# Patient Record
Sex: Male | Born: 1985 | Race: White | Hispanic: No | Marital: Single | Smoking: Never smoker
Health system: Southern US, Community
[De-identification: ages and names within clinical notes are randomized; demographics above are authoritative.]

## PROBLEM LIST (undated history)

## (undated) DIAGNOSIS — R51 Headache: Secondary | ICD-10-CM

## (undated) DIAGNOSIS — K219 Gastro-esophageal reflux disease without esophagitis: Secondary | ICD-10-CM

## (undated) DIAGNOSIS — R519 Headache, unspecified: Secondary | ICD-10-CM

## (undated) DIAGNOSIS — F329 Major depressive disorder, single episode, unspecified: Secondary | ICD-10-CM

## (undated) DIAGNOSIS — M779 Enthesopathy, unspecified: Secondary | ICD-10-CM

## (undated) DIAGNOSIS — F32A Depression, unspecified: Secondary | ICD-10-CM

## (undated) DIAGNOSIS — D649 Anemia, unspecified: Secondary | ICD-10-CM

## (undated) DIAGNOSIS — F419 Anxiety disorder, unspecified: Secondary | ICD-10-CM

## (undated) HISTORY — DX: Anemia, unspecified: D64.9

## (undated) HISTORY — DX: Enthesopathy, unspecified: M77.9

## (undated) HISTORY — DX: Gastro-esophageal reflux disease without esophagitis: K21.9

## (undated) HISTORY — DX: Depression, unspecified: F32.A

## (undated) HISTORY — DX: Major depressive disorder, single episode, unspecified: F32.9

## (undated) HISTORY — DX: Headache: R51

## (undated) HISTORY — DX: Headache, unspecified: R51.9

## (undated) HISTORY — DX: Anxiety disorder, unspecified: F41.9

---

## 2013-08-16 ENCOUNTER — Ambulatory Visit (INDEPENDENT_AMBULATORY_CARE_PROVIDER_SITE_OTHER): Payer: BC Managed Care – PPO | Admitting: Family Medicine

## 2013-08-16 ENCOUNTER — Encounter: Payer: Self-pay | Admitting: Family Medicine

## 2013-08-16 VITALS — BP 122/84 | HR 90 | Temp 100.0°F | Ht 73.0 in | Wt 199.0 lb

## 2013-08-16 DIAGNOSIS — J029 Acute pharyngitis, unspecified: Secondary | ICD-10-CM

## 2013-08-16 DIAGNOSIS — J020 Streptococcal pharyngitis: Secondary | ICD-10-CM

## 2013-08-16 MED ORDER — AMOXICILLIN 875 MG PO TABS: 875.0000 mg | ORAL_TABLET | Freq: Two times a day (BID) | ORAL | Status: DC

## 2013-08-16 NOTE — Progress Notes (Signed)
  Subjective:    Patient ID: Tyler Chase, male    DOB: [birth year], 27 y.o.   MRN: [id number]  HPI New to establish.  No consistent PCP recently.  Neuro- Dr Tyler Chase in [place] (migraines)  Sore throat- started w/ feeling run down on Friday, by Saturday had fever.  + sore throat.  + nasal congestion- mild.  Intermittent ear pain.  Rare dry cough.  No known sick contacts.  No nausea/vomiting/diarrhea.   Review of Systems For ROS see HPI     Objective:   Physical Exam  Vitals reviewed. Constitutional: He appears well-developed and well-nourished. No distress.  HENT:  Head: Normocephalic and atraumatic.  No TTP over sinuses TMs WNL bilaterally Bilateral tonsillar enlargement, erythema w/ exudate present  Cardiovascular: Normal rate, regular rhythm and normal heart sounds.   Pulmonary/Chest: Effort normal and breath sounds normal. No respiratory distress. He has no wheezes. He has no rales.  Lymphadenopathy:    He has cervical adenopathy (TTP).  Skin: Skin is warm.          Assessment & Plan:

## 2013-08-16 NOTE — Patient Instructions (Addendum)
This is strep Start the Amoxicillin twice daily- take w/ food Drink plenty of fluids REST! Start ibuprofen for pain and fever Hang in there!!

## 2013-08-16 NOTE — Assessment & Plan Note (Signed)
New.  Despite negative rapid test, his clinical sxs and PE consistent w/ strep (fever, tonsillar enlargement w/ erythema and exudate, tender LAD).  Start amox.  Reviewed supportive care and red flags that should prompt return.  Pt expressed understanding and is in agreement w/ plan.

## 2013-10-13 ENCOUNTER — Telehealth: Payer: Self-pay

## 2013-10-13 NOTE — Telephone Encounter (Addendum)
Medication List and allergies: reviewed and updated  90 day supply/mail order: na Local prescriptions: [facility] [address]  Immunizations due: offered Tdap  A/P:   Flu vacccine UTD 09/15/2013 No change in FH or PSH  To Discuss with Provider: Will discuss questions with provider

## 2013-10-13 NOTE — Telephone Encounter (Signed)
Left message for call back Non identifiable  

## 2013-10-14 ENCOUNTER — Encounter: Payer: Self-pay | Admitting: Family Medicine

## 2013-10-14 ENCOUNTER — Ambulatory Visit (INDEPENDENT_AMBULATORY_CARE_PROVIDER_SITE_OTHER): Payer: BC Managed Care – PPO | Admitting: Family Medicine

## 2013-10-14 VITALS — BP 130/88 | HR 88 | Temp 98.3°F | Resp 16 | Ht 72.5 in | Wt 193.5 lb

## 2013-10-14 DIAGNOSIS — Z20828 Contact with and (suspected) exposure to other viral communicable diseases: Secondary | ICD-10-CM

## 2013-10-14 DIAGNOSIS — Z0000 Encounter for general adult medical examination without abnormal findings: Secondary | ICD-10-CM

## 2013-10-14 DIAGNOSIS — K219 Gastro-esophageal reflux disease without esophagitis: Secondary | ICD-10-CM

## 2013-10-14 DIAGNOSIS — G4700 Insomnia, unspecified: Secondary | ICD-10-CM

## 2013-10-14 DIAGNOSIS — R159 Full incontinence of feces: Secondary | ICD-10-CM

## 2013-10-14 DIAGNOSIS — F411 Generalized anxiety disorder: Secondary | ICD-10-CM

## 2013-10-14 DIAGNOSIS — K625 Hemorrhage of anus and rectum: Secondary | ICD-10-CM | POA: Insufficient documentation

## 2013-10-14 LAB — CBC WITH DIFFERENTIAL/PLATELET
Basophils Relative: 0.4 % (ref 0.0–3.0)
Eosinophils Absolute: 0.1 10*3/uL (ref 0.0–0.7)
HCT: 45.2 % (ref 39.0–52.0)
Hemoglobin: 14.5 g/dL (ref 13.0–17.0)
Lymphocytes Relative: 26.7 % (ref 12.0–46.0)
Lymphs Abs: 1.6 10*3/uL (ref 0.7–4.0)
MCHC: 32.1 g/dL (ref 30.0–36.0)
MCV: 69.3 fl — ABNORMAL LOW (ref 78.0–100.0)
Neutro Abs: 4 10*3/uL (ref 1.4–7.7)
RBC: 6.53 Mil/uL — ABNORMAL HIGH (ref 4.22–5.81)

## 2013-10-14 LAB — HEPATIC FUNCTION PANEL
ALT: 20 U/L (ref 0–53)
Total Bilirubin: 1.1 mg/dL (ref 0.3–1.2)
Total Protein: 7.5 g/dL (ref 6.0–8.3)

## 2013-10-14 LAB — BASIC METABOLIC PANEL
BUN: 15 mg/dL (ref 6–23)
Chloride: 103 mEq/L (ref 96–112)
Creatinine, Ser: 1.3 mg/dL (ref 0.4–1.5)

## 2013-10-14 LAB — TSH: TSH: 0.69 u[IU]/mL (ref 0.35–5.50)

## 2013-10-14 LAB — LIPID PANEL
Cholesterol: 157 mg/dL (ref 0–200)
VLDL: 16.4 mg/dL (ref 0.0–40.0)

## 2013-10-14 MED ORDER — FLUOXETINE HCL 20 MG PO TABS: 20.0000 mg | ORAL_TABLET | Freq: Every day | ORAL | Status: DC

## 2013-10-14 MED ORDER — TRAZODONE HCL 100 MG PO TABS: 100.0000 mg | ORAL_TABLET | Freq: Every day | ORAL | Status: DC

## 2013-10-14 MED ORDER — PANTOPRAZOLE SODIUM 40 MG PO TBEC: 40.0000 mg | DELAYED_RELEASE_TABLET | Freq: Every day | ORAL | Status: DC

## 2013-10-14 NOTE — Progress Notes (Signed)
  Subjective:    Patient ID: Tyler Chase, male    DOB: 01/28/1986, 27 y.o.   MRN: [id number]  HPI CPE-   Insomnia- pt was given Trazodone by Neuro (Dr Carlton Adam) 1 month ago.  Feels that the current dose (50mg ?) is not very helpful in keeping him asleep which is the problem.  Anxiety- chronic problem, pt doesn't feel sxs are well controlled.  Currently taking klonopin as needed (came from previous doctor).  Has previously been on Wellbutrin and Citalopram w/out relief.  Fecal leakage- sxs started 1-2 yrs ago.  Will note itching 1-2 hrs after BM and return to bathroom, needing to re-wipe.  Has fecal matter on tissue but not in underwear.  Will have BRBPR ~1-2x/month.  GERD- pt reports frequent and severe heartburn.  Will take tums as needed.  Has not tried OTC acid suppression.   Review of Systems Patient reports no vision/hearing changes, anorexia, fever ,adenopathy, persistant/recurrent hoarseness, swallowing issues, chest pain, palpitations, edema, persistant/recurrent cough, hemoptysis, dyspnea (rest,exertional, paroxysmal nocturnal), abdominal pain, GU symptoms (dysuria, hematuria, voiding/incontinence issues) syncope, focal weakness, memory loss, numbness & tingling, skin/hair/nail changes, depression, abnormal bruising/bleeding, musculoskeletal symptoms/signs.     Objective:   Physical Exam BP 130/88  Pulse 88  Temp(Src) 98.3 F (36.8 C) (Oral)  Resp 16  Ht 6' 0.5" (1.842 m)  Wt 193 lb 8 oz (87.771 kg)  BMI 25.87 kg/m2  SpO2 95%  General Appearance:    Alert, cooperative, no distress, appears stated age  Head:    Normocephalic, without obvious abnormality, atraumatic  Eyes:    PERRL, conjunctiva/corneas clear, EOM's intact, fundi    benign, both eyes       Ears:    Normal TM's and external ear canals, both ears  Nose:   Nares normal, septum midline, mucosa normal, no drainage   or sinus tenderness  Throat:   Lips, mucosa, and tongue normal; teeth and gums normal   Neck:   Supple, symmetrical, trachea midline, no adenopathy;       thyroid:  No enlargement/tenderness/nodules  Back:     Symmetric, no curvature, ROM normal, no CVA tenderness  Lungs:     Clear to auscultation bilaterally, respirations unlabored  Chest wall:    No tenderness or deformity  Heart:    Regular rate and rhythm, S1 and S2 normal, no murmur, rub   or gallop  Abdomen:     Soft, non-tender, bowel sounds active all four quadrants,    no masses, no organomegaly  Genitalia:    Deferred at pt's request  Rectal:    Deferred due to young age  Extremities:   Extremities normal, atraumatic, no cyanosis or edema  Pulses:   2+ and symmetric all extremities  Skin:   Skin color, texture, turgor normal, no rashes or lesions  Lymph nodes:   Cervical, supraclavicular, and axillary nodes normal  Neurologic:   CNII-XII intact. Normal strength, sensation and reflexes      throughout          Assessment & Plan:

## 2013-10-14 NOTE — Patient Instructions (Signed)
Follow up in 4-6 weeks to recheck mood Start the Prozac daily Increase the trazodone to 100mg  nightly (1 tab of new script) We'll call you with your GI appt We'll notify you of your lab results and make any changes if needed Start the Protonix daily for reflux Call with any questions or concerns Happy Halloween!

## 2013-10-15 NOTE — Assessment & Plan Note (Signed)
New to provider.  On Trazodone per neuro.  Not effective.  Increase dose.

## 2013-10-15 NOTE — Assessment & Plan Note (Signed)
New.  Pt reports fecal leakage after BMs.  Not soiling underwear but requiring him to return to the restroom to wipe.  Refer to GI for complete evaluation and tx

## 2013-10-15 NOTE — Assessment & Plan Note (Signed)
New.  Suspect hemorrhoids but given associated fecal leakage will refer to GI- especially since pt declined rectal exam.

## 2013-10-15 NOTE — Assessment & Plan Note (Addendum)
New to provider.  Not well controlled.  Start daily controller medication.  Will monitor for improvement.

## 2013-10-15 NOTE — Assessment & Plan Note (Signed)
Pt's PE WNL despite numerous complaints.  Check labs.  Anticipatory guidance provided.

## 2013-10-15 NOTE — Assessment & Plan Note (Signed)
New.  Pt reports sxs are severe and will have nocturnal regurgitation.  Start PPI.  Will monitor for improvement.

## 2013-10-17 ENCOUNTER — Encounter: Payer: Self-pay | Admitting: General Practice

## 2013-10-28 ENCOUNTER — Ambulatory Visit (INDEPENDENT_AMBULATORY_CARE_PROVIDER_SITE_OTHER): Payer: BC Managed Care – PPO | Admitting: Gastroenterology

## 2013-10-28 ENCOUNTER — Other Ambulatory Visit (INDEPENDENT_AMBULATORY_CARE_PROVIDER_SITE_OTHER): Payer: BC Managed Care – PPO

## 2013-10-28 ENCOUNTER — Encounter: Payer: Self-pay | Admitting: Gastroenterology

## 2013-10-28 VITALS — BP 96/70 | HR 72 | Ht 71.5 in | Wt 188.4 lb

## 2013-10-28 DIAGNOSIS — K3189 Other diseases of stomach and duodenum: Secondary | ICD-10-CM

## 2013-10-28 DIAGNOSIS — R634 Abnormal weight loss: Secondary | ICD-10-CM

## 2013-10-28 DIAGNOSIS — K625 Hemorrhage of anus and rectum: Secondary | ICD-10-CM

## 2013-10-28 DIAGNOSIS — R197 Diarrhea, unspecified: Secondary | ICD-10-CM

## 2013-10-28 LAB — FOLATE: Folate: >24.8 ng/mL (ref >5.9)

## 2013-10-28 LAB — FERRITIN: Ferritin: 53.6 ng/mL (ref 22.0–322.0)

## 2013-10-28 LAB — VITAMIN B12: Vitamin B-12: 597 pg/mL (ref 211–911)

## 2013-10-28 LAB — IBC PANEL
Iron: 181 ug/dL — ABNORMAL HIGH (ref 42–165)
Transferrin: 253.1 mg/dL (ref 212.0–360.0)

## 2013-10-28 MED ORDER — HYDROCORTISONE ACETATE 25 MG RE SUPP: 25.0000 mg | Freq: Two times a day (BID) | RECTAL | Status: DC

## 2013-10-28 NOTE — Assessment & Plan Note (Signed)
Limited rectal bleeding and soreness are 2 to hemorrhoidal disease  Recommendations #1 Anusol a.c. Suppositories #2 band ligation of hemorrhoids

## 2013-10-28 NOTE — Assessment & Plan Note (Signed)
While the patient complains of nonspecific abdominal bloating, lab work was pertinent for microcytosis.  With history of anemia and family history of anemia the question of celiac disease is raised.  Recommendations #1 check serologies for celiac disease, iron, TIBC, ferritin folate and B12 levels

## 2013-10-28 NOTE — Patient Instructions (Signed)
Go to the basement for labs today Your Hemorrhoidal banding is scheduled on 11/02/2013 at 2pm If you need to cancel this appointment call [phone] to cancel

## 2013-10-28 NOTE — Progress Notes (Signed)
History of Present Illness: 27 year old white male referred for evaluation of rectal bleeding and soreness.  Soreness is been a problem for years.  He has pain when he moves his bowels.  He's complaining of rectal itching.  Most recently he's been seen small amounts of blood on the toilet tissue with every bowel movement.  He moves his bowels regularly.  Patient complains of abdominal bloating.  He was told to be anemic when he was a teenager.  Recent lab work was pertinent for hemoglobin 14.5 and MCV 69.3.  He related that both his sister and mother were told to be anemic.    Past Medical History  Diagnosis Date  . Depression   . Generalized headaches   . GERD (gastroesophageal reflux disease)   . Anemia   . Anxiety    History reviewed. No pertinent past surgical history. family history includes Arthritis in his mother; Heart disease in his maternal grandfather; Hypertension in his maternal grandfather; Lymphoma in his father; Mental illness in his maternal uncle; Migraines in his mother. Current Outpatient Prescriptions  Medication Sig Dispense Refill  . FLUoxetine (PROZAC) 20 MG tablet Take 1 tablet (20 mg total) by mouth daily.  30 tablet  3  . pantoprazole (PROTONIX) 40 MG tablet Take 40 mg by mouth daily as needed.      . topiramate (TOPAMAX) 25 MG capsule Take 25 mg by mouth 3 (three) times daily.      . traZODone (DESYREL) 100 MG tablet Take 100 mg by mouth at bedtime as needed.       No current facility-administered medications for this visit.   Allergies as of 10/28/2013  . (No Known Allergies)    reports that he has never smoked. He has never used smokeless tobacco. He reports that he drinks alcohol. He reports that he does not use illicit drugs.     Review of Systems: Pertinent positive and negative review of systems were noted in the above HPI section. All other review of systems were otherwise negative.  Vital signs were reviewed in today's medical record Physical  Exam: General: Well developed , well nourished, no acute distress Skin: anicteric Head: Normocephalic and atraumatic Eyes:  sclerae anicteric, EOMI Ears: Normal auditory acuity Mouth: No deformity or lesions Neck: Supple, no masses or thyromegaly Lungs: Clear throughout to auscultation Heart: Regular rate and rhythm; no murmurs, rubs or bruits Abdomen: Soft, non tender and non distended. No masses, hepatosplenomegaly or hernias noted. Normal Bowel sounds Rectal: See anoscopic exam report Musculoskeletal: Symmetrical with no gross deformities  Skin: No lesions on visible extremities Pulses:  Normal pulses noted Extremities: No clubbing, cyanosis, edema or deformities noted Neurological: Alert oriented x 4, grossly nonfocal Cervical Nodes:  No significant cervical adenopathy Inguinal Nodes: No significant inguinal adenopathy Psychological:  Alert and cooperative. Normal mood and affect  Anoscopy was done.  Internal hemorrhoids were seen.  There were no obvious fissures or masses.

## 2013-11-01 ENCOUNTER — Telehealth: Payer: Self-pay

## 2013-11-01 LAB — RETICULIN ANTIBODIES, IGA W TITER

## 2013-11-01 LAB — GLIADIN ANTIBODIES, SERUM: Gliadin IgA: 4.2 U/mL (ref <20)

## 2013-11-02 ENCOUNTER — Encounter: Payer: Self-pay | Admitting: Gastroenterology

## 2013-11-02 ENCOUNTER — Ambulatory Visit (INDEPENDENT_AMBULATORY_CARE_PROVIDER_SITE_OTHER): Payer: BC Managed Care – PPO | Admitting: Gastroenterology

## 2013-11-02 VITALS — BP 100/60 | HR 60 | Ht 71.5 in | Wt 188.0 lb

## 2013-11-02 DIAGNOSIS — K648 Other hemorrhoids: Secondary | ICD-10-CM

## 2013-11-02 DIAGNOSIS — K602 Anal fissure, unspecified: Secondary | ICD-10-CM

## 2013-11-02 NOTE — Progress Notes (Signed)
PROCEDURE NOTE: The patient presents with symptomatic grade *2**  hemorrhoids, requesting rubber band ligation of his/her hemorrhoidal disease.  All risks, benefits and alternative forms of therapy were described and informed consent was obtained.   Endoscopy was performed.  The 3 hemorrhoidal bundles were identified.  There were no fissures.  The anorectum was pre-medicated with lubricant and nitroglycerine ointment The decision was made to band the *left lateral** internal hemorrhoid, and the [facility] O'Regan System was used to perform band ligation without complication.  Digital anorectal examination was then performed to assure proper positioning of the band, and to adjust the banded tissue as required.  The patient was discharged home without pain or other issues.  Dietary and behavioral recommendations were given and along with follow-up instructions.  because of rectal burning and itching following a bowel movement he was prescribed nitroglycerin ointment for presumed fissure.      The patient will return in *2* for  follow-up and possible additional banding as required. No complications were encountered and the patient tolerated the procedure well.

## 2013-11-02 NOTE — Patient Instructions (Addendum)
HEMORRHOID BANDING PROCEDURE    FOLLOW-UP CARE   1. The procedure you have had should have been relatively painless since the banding of the area involved does not have nerve endings and there is no pain sensation.  The rubber band cuts off the blood supply to the hemorrhoid and the band may fall off as soon as 48 hours after the banding (the band may occasionally be seen in the toilet bowl following a bowel movement). You may notice a temporary feeling of fullness in the rectum which should respond adequately to plain Tylenol or Motrin.  2. Following the banding, avoid strenuous exercise that evening and resume full activity the next day.  A sitz bath (soaking in a warm tub) or bidet is soothing, and can be useful for cleansing the area after bowel movements.     3. To avoid constipation, take two tablespoons of natural wheat bran, natural oat bran, flax, Benefiber or any over the counter fiber supplement and increase your water intake to 7-8 glasses daily.    4. Unless you have been prescribed anorectal medication, do not put anything inside your rectum for two weeks: No suppositories, enemas, fingers, etc.  5. Occasionally, you may have more bleeding than usual after the banding procedure.  This is often from the untreated hemorrhoids rather than the treated one.  Don't be concerned if there is a tablespoon or so of blood.  If there is more blood than this, lie flat with your bottom higher than your head and apply an ice pack to the area. If the bleeding does not stop within a half an hour or if you feel faint, call our office at [phone] or go to the emergency room.  6. Problems are not common; however, if there is a substantial amount of bleeding, severe pain, chills, fever or difficulty passing urine (very rare) or other problems, you should call us at [phone] or report to the nearest emergency room.  7. Do not stay seated continuously for more than 2-3 hours for a day or two  after the procedure.  Tighten your buttock muscles 10-15 times every two hours and take 10-15 deep breaths every 1-2 hours.  Do not spend more than a few minutes on the toilet if you cannot empty your bowel; instead re-visit the toilet at a later time.   Your 2nd banding is scheduled on 12/21/2013 at 9:45am    Patient Drug Education for Nitroglycerin Ointment   A pea-sized drop should be placed on the tip of your finger and then gently placed inside the anus. The finger should be inserted 1/3 - 1/2 its length and may be covered with a plastic glove or finger cot. You may use Vaseline to help coat the finger or dilute the ointment.  The first few applications should be taken lying down, as mild light-headedness or a brief headache may occur.  The most common side effect - a headache. It is usually brief and mild, but may require Tylenol or Advil. You may dilute the NTG further with Vaseline to decrease the headaches. As the treatment progresses and the hemorrhoid begins to heal, the headaches will tend to dissipate. Other side effects include lightheadedness, flushing, dizziness, nervousness, nausea, and vomiting. If any of these side effects persist or worsen, notify us promptly. Stop using the NTG and notify us immediately if you develop the rare side effects of severe dizziness, fainting, fast/pounding heartbeat, paleness, sweating, blurred vision, dry mouth, dark urine, bluish lips/skin/nails,  unusual tiredness, severe weakness, irregular heartbeat, seizures, or chest pain. Serious allergic reactions are unusual, but seek immediate medical attention if you develop a rash, swelling, dizziness, or trouble breathing.  Tell us if you are allergic to nitrates, have severe anemia, low blood pressure, dehydration, chronic heart failure, cardiomyopathy, recent heart attack, increased pressure in the brain, or exposure to nitrates while on the job. Do not use NTG while driving or working around machinery if  you are drowsy, dizzy, have lightheadedness, or blurred vision. Limit alcoholic beverages. To minimize dizziness and lightheadedness, get up slowly when rising from a sitting or lying position. The elderly may be more prone to dizziness and falling. While there are not adequate studies to confirm the safety of NTG in pregnant or breast feeding women, it has been used without incident so far. We recommend waiting at least one hour after applying the NTG ointment before breast feeding.  Do not use NTG ointment if you are taking drugs for sexual problems [e.g., sildenafil (Viagra), tadalafil (Cialis), vardenafil (Levitra)]. Use caution before taking cough-and-cold products, diet aids, or NSAIDs preparations because they may contain ingredients that could increase your blood pressure, cause a fast heartbeat, or increase chest pain (e.g., pseudoephedrine, phenylephrine, chlorpheniramine, diphenhydramine, clemastine, ibuprofen, and naproxen). Tell us if you drink alcohol, take alteplase, migraine drugs (ergotamine), water pills/diuretics such as furosemide or hydrochlorothiazide, or other drugs for high blood pressure (beta blockers, calcium channel blockers, ACE inhibitors).  Store the NTG at room temperature and keep away from light and moisture. Close the container tightly after each use. Do not store in the bathroom. Keep away from children and pets. If you have any questions or problems please call us at  [phone].

## 2013-11-25 ENCOUNTER — Ambulatory Visit: Payer: BC Managed Care – PPO | Admitting: Family Medicine

## 2013-12-02 ENCOUNTER — Ambulatory Visit (INDEPENDENT_AMBULATORY_CARE_PROVIDER_SITE_OTHER): Payer: BC Managed Care – PPO | Admitting: Family Medicine

## 2013-12-02 ENCOUNTER — Encounter: Payer: Self-pay | Admitting: Family Medicine

## 2013-12-02 VITALS — BP 120/76 | HR 103 | Temp 98.2°F | Resp 16 | Wt 192.2 lb

## 2013-12-02 DIAGNOSIS — F411 Generalized anxiety disorder: Secondary | ICD-10-CM

## 2013-12-02 DIAGNOSIS — G4700 Insomnia, unspecified: Secondary | ICD-10-CM

## 2013-12-02 MED ORDER — FLUOXETINE HCL 40 MG PO CAPS: 40.0000 mg | ORAL_CAPSULE | Freq: Every day | ORAL | Status: DC

## 2013-12-02 MED ORDER — ZOLPIDEM TARTRATE ER 6.25 MG PO TBCR: 6.2500 mg | EXTENDED_RELEASE_TABLET | Freq: Every evening | ORAL | Status: DC | PRN

## 2013-12-02 NOTE — Assessment & Plan Note (Signed)
Unchanged.  No improvement w/ increased dose of Trazodone and pt is left feeling groggy.  Switch to lose dose, extended release Ambien.  Will monitor for improvement.

## 2013-12-02 NOTE — Progress Notes (Signed)
Pre visit review using our clinic review tool, if applicable. No additional management support is needed unless otherwise documented below in the visit note. 

## 2013-12-02 NOTE — Progress Notes (Signed)
   Subjective:    Patient ID: Tyler Chase, male    DOB: [birth year], 27 y.o.   MRN: [id number]  HPI Anxiety- chronic problem, started prozac at last visit.  Pt feels meds are helping.  Feels more at ease.  No side effects from meds- nausea, vomiting, palpitations, HAs.  Insomia- no improvement in sleep despite increasing trazodone to 100mg .  Difficulty is in staying asleep.  Waking up feeling groggy after taking Trazodone.     Review of Systems For ROS see HPI     Objective:   Physical Exam  Vitals reviewed. Constitutional: He is oriented to person, place, and time. He appears well-developed and well-nourished. No distress.  HENT:  Head: Normocephalic and atraumatic.  Neurological: He is alert and oriented to person, place, and time.  Psychiatric: He has a normal mood and affect. His behavior is normal. Thought content normal.          Assessment & Plan:

## 2013-12-02 NOTE — Assessment & Plan Note (Signed)
Improved since starting Prozac.  Will increase to 40mg  and monitor for continued improvement.

## 2013-12-02 NOTE — Patient Instructions (Signed)
Follow up in 6-8 weeks to recheck mood/sleep Increase the Prozac to 40mg - 2 of what you have at home and 1 of the new prescription Start the Ambien as needed for sleep Call with any questions or concerns Happy Holidays!

## 2013-12-21 ENCOUNTER — Encounter: Payer: Self-pay | Admitting: Gastroenterology

## 2013-12-21 ENCOUNTER — Ambulatory Visit (INDEPENDENT_AMBULATORY_CARE_PROVIDER_SITE_OTHER): Payer: BC Managed Care – PPO | Admitting: Gastroenterology

## 2013-12-21 VITALS — BP 120/84 | HR 80 | Ht 71.5 in | Wt 193.4 lb

## 2013-12-21 DIAGNOSIS — K648 Other hemorrhoids: Secondary | ICD-10-CM

## 2013-12-21 NOTE — Progress Notes (Signed)
PROCEDURE NOTE: The patient presents with symptomatic grade *2**  hemorrhoids, requesting rubber band ligation of his/her hemorrhoidal disease.  All risks, benefits and alternative forms of therapy were described and informed consent was obtained.   The anorectum was pre-medicated with lubricant and nitroglycerine ointment The decision was made to band the *right posterior** internal hemorrhoid, and the [facility] O'Regan System was used to perform band ligation without complication.  Digital anorectal examination was then performed to assure proper positioning of the band, and to adjust the banded tissue as required.  The patient was discharged home without pain or other issues.  Dietary and behavioral recommendations were given and along with follow-up instructions.    The patient will return in **2* for  follow-up and possible additional banding as required. No complications were encountered and the patient tolerated the procedure well.   

## 2013-12-21 NOTE — Patient Instructions (Signed)
Your 3rd banding is scheduled on 01/16/2014 at 8:30am   [place]   1. The procedure you have had should have been relatively painless since the banding of the area involved does not have nerve endings and there is no pain sensation.  The rubber band cuts off the blood supply to the hemorrhoid and the band may fall off as soon as 48 hours after the banding (the band may occasionally be seen in the toilet bowl following a bowel movement). You may notice a temporary feeling of fullness in the rectum which should respond adequately to plain Tylenol or Motrin.  2. Following the banding, avoid strenuous exercise that evening and resume full activity the next day.  A sitz bath (soaking in a warm tub) or bidet is soothing, and can be useful for cleansing the area after bowel movements.     3. To avoid constipation, take two tablespoons of natural wheat bran, natural oat bran, flax, Benefiber or any over the counter fiber supplement and increase your water intake to 7-8 glasses daily.    4. Unless you have been prescribed anorectal medication, do not put anything inside your rectum for two weeks: No suppositories, enemas, fingers, etc.  5. Occasionally, you may have more bleeding than usual after the banding procedure.  This is often from the untreated hemorrhoids rather than the treated one.  Don't be concerned if there is a tablespoon or so of blood.  If there is more blood than this, lie flat with your bottom higher than your head and apply an ice pack to the area. If the bleeding does not stop within a half an hour or if you feel faint, call our office at [phone] or go to the emergency room.  6. Problems are not common; however, if there is a substantial amount of bleeding, severe pain, chills, fever or difficulty passing urine (very rare) or other problems, you should call us at [phone] or report to the nearest emergency room.  7. Do not stay  seated continuously for more than 2-3 hours for a day or two after the procedure.  Tighten your buttock muscles 10-15 times every two hours and take 10-15 deep breaths every 1-2 hours.  Do not spend more than a few minutes on the toilet if you cannot empty your bowel; instead re-visit the toilet at a later time.

## 2013-12-22 ENCOUNTER — Ambulatory Visit (INDEPENDENT_AMBULATORY_CARE_PROVIDER_SITE_OTHER): Payer: BC Managed Care – PPO | Admitting: Family Medicine

## 2013-12-22 VITALS — BP 130/86 | HR 114 | Temp 98.9°F

## 2013-12-22 DIAGNOSIS — R6889 Other general symptoms and signs: Secondary | ICD-10-CM

## 2013-12-22 DIAGNOSIS — J111 Influenza due to unidentified influenza virus with other respiratory manifestations: Secondary | ICD-10-CM | POA: Insufficient documentation

## 2013-12-22 LAB — POCT INFLUENZA A/B
INFLUENZA B, POC: NEGATIVE
Influenza A, POC: POSITIVE

## 2013-12-22 MED ORDER — OSELTAMIVIR PHOSPHATE 75 MG PO CAPS: 75.0000 mg | ORAL_CAPSULE | Freq: Two times a day (BID) | ORAL | Status: DC

## 2013-12-22 NOTE — Assessment & Plan Note (Signed)
New.  + flu test.  Start tamiflu.  Reviewed supportive care and red flags that should prompt return.  Pt expressed understanding and is in agreement w/ plan.

## 2013-12-22 NOTE — Patient Instructions (Signed)
Follow up as needed Start the Tamiflu today- get both doses in Drink plenty of fluids REST! Alternate tylenol and ibuprofen every 4 hrs Call with any questions or concerns Hang in there!!!

## 2013-12-22 NOTE — Progress Notes (Signed)
   Subjective:    Patient ID: Anvay Tennis, male    DOB: [birth year], 28 y.o.   MRN: [id number]  HPI Flu- sxs started overnight, suddenly.  Yesterday had mild muscle aches after hemorrhoidectomy.  + chills, body aches.  + nausea, no vomiting.  + dizziness.  No diarrhea.  No cough.  Mild nasal congestion.   Review of Systems For ROS see HPI     Objective:   Physical Exam  Vitals reviewed. Constitutional: He is oriented to person, place, and time. He appears well-developed and well-nourished. He appears distressed (clearly not feeling well).  HENT:  Head: Normocephalic and atraumatic.  Mouth/Throat: Oropharynx is clear and moist. No oropharyngeal exudate.  TMs WNL bilaterally No TTP over sinuses + nasal congestion  Neck: Normal range of motion. Neck supple.  Cardiovascular:  Tachy but regular  Pulmonary/Chest: Effort normal and breath sounds normal. No respiratory distress. He has no wheezes. He has no rales.  Lymphadenopathy:    He has cervical adenopathy.  Neurological: He is alert and oriented to person, place, and time.  Skin: Skin is warm.  diaphoretic          Assessment & Plan:

## 2014-01-16 ENCOUNTER — Encounter: Payer: Self-pay | Admitting: Gastroenterology

## 2014-01-16 ENCOUNTER — Ambulatory Visit (INDEPENDENT_AMBULATORY_CARE_PROVIDER_SITE_OTHER): Payer: BC Managed Care – PPO | Admitting: Gastroenterology

## 2014-01-16 VITALS — BP 110/72 | HR 84 | Ht 71.5 in | Wt 191.2 lb

## 2014-01-16 DIAGNOSIS — K648 Other hemorrhoids: Secondary | ICD-10-CM

## 2014-01-16 NOTE — Patient Instructions (Signed)
HEMORRHOID BANDING PROCEDURE    FOLLOW-UP CARE   1. The procedure you have had should have been relatively painless since the banding of the area involved does not have nerve endings and there is no pain sensation.  The rubber band cuts off the blood supply to the hemorrhoid and the band may fall off as soon as 48 hours after the banding (the band may occasionally be seen in the toilet bowl following a bowel movement). You may notice a temporary feeling of fullness in the rectum which should respond adequately to plain Tylenol or Motrin.  2. Following the banding, avoid strenuous exercise that evening and resume full activity the next day.  A sitz bath (soaking in a warm tub) or bidet is soothing, and can be useful for cleansing the area after bowel movements.     3. To avoid constipation, take two tablespoons of natural wheat bran, natural oat bran, flax, Benefiber or any over the counter fiber supplement and increase your water intake to 7-8 glasses daily.    4. Unless you have been prescribed anorectal medication, do not put anything inside your rectum for two weeks: No suppositories, enemas, fingers, etc.  5. Occasionally, you may have more bleeding than usual after the banding procedure.  This is often from the untreated hemorrhoids rather than the treated one.  Don't be concerned if there is a tablespoon or so of blood.  If there is more blood than this, lie flat with your bottom higher than your head and apply an ice pack to the area. If the bleeding does not stop within a half an hour or if you feel faint, call our office at [phone] or go to the emergency room.  6. Problems are not common; however, if there is a substantial amount of bleeding, severe pain, chills, fever or difficulty passing urine (very rare) or other problems, you should call us at [phone] or report to the nearest emergency room.  7. Do not stay seated continuously for more than 2-3 hours for a day or two  after the procedure.  Tighten your buttock muscles 10-15 times every two hours and take 10-15 deep breaths every 1-2 hours.  Do not spend more than a few minutes on the toilet if you cannot empty your bowel; instead re-visit the toilet at a late       fOLLOW UP IN ONE MONTH

## 2014-01-16 NOTE — Progress Notes (Signed)
PROCEDURE NOTE: The patient presents with symptomatic grade *2**  hemorrhoids, requesting rubber band ligation of his/her hemorrhoidal disease.  All risks, benefits and alternative forms of therapy were described and informed consent was obtained.   The anorectum was pre-medicated with lubricant and nitroglycerine ointment The decision was made to band the *right anterior** internal hemorrhoid, and the [place] was used to perform band ligation without complication.  Digital anorectal examination was then performed to assure proper positioning of the band, and to adjust the banded tissue as required.  The patient was discharged home without pain or other issues.  Dietary and behavioral recommendations were given and along with follow-up instructions.    The patient will return in *4** No complications were encountered and the patient tolerated the procedure well.

## 2014-01-20 ENCOUNTER — Ambulatory Visit: Payer: BC Managed Care – PPO | Admitting: Family Medicine

## 2014-01-20 DIAGNOSIS — Z0289 Encounter for other administrative examinations: Secondary | ICD-10-CM

## 2014-02-24 ENCOUNTER — Ambulatory Visit: Payer: BC Managed Care – PPO | Admitting: Family Medicine

## 2014-03-10 ENCOUNTER — Encounter: Payer: Self-pay | Admitting: Family Medicine

## 2014-03-10 ENCOUNTER — Ambulatory Visit (INDEPENDENT_AMBULATORY_CARE_PROVIDER_SITE_OTHER): Payer: BC Managed Care – PPO | Admitting: Family Medicine

## 2014-03-10 ENCOUNTER — Other Ambulatory Visit: Payer: Self-pay | Admitting: General Practice

## 2014-03-10 VITALS — BP 120/76 | HR 81 | Temp 98.2°F | Resp 16 | Wt 198.1 lb

## 2014-03-10 DIAGNOSIS — G4700 Insomnia, unspecified: Secondary | ICD-10-CM

## 2014-03-10 DIAGNOSIS — B360 Pityriasis versicolor: Secondary | ICD-10-CM | POA: Insufficient documentation

## 2014-03-10 DIAGNOSIS — F411 Generalized anxiety disorder: Secondary | ICD-10-CM

## 2014-03-10 MED ORDER — ITRACONAZOLE 200 MG PO TABS: 1.0000 | ORAL_TABLET | Freq: Every day | ORAL | Status: DC

## 2014-03-10 MED ORDER — ZOLPIDEM TARTRATE ER 12.5 MG PO TBCR: 12.5000 mg | EXTENDED_RELEASE_TABLET | Freq: Every evening | ORAL | Status: DC | PRN

## 2014-03-10 NOTE — Assessment & Plan Note (Signed)
New.  Start oral itraconazole as pt does not want to use the topical txs.  Reviewed supportive care and red flags that should prompt return.  Pt expressed understanding and is in agreement w/ plan.

## 2014-03-10 NOTE — Assessment & Plan Note (Signed)
Improved on Ambien but pt would like to try an increased dose and see if sxs improve.  Start Ambien CR 12.5mg  and monitor for improvement.

## 2014-03-10 NOTE — Progress Notes (Signed)
   Subjective:    Patient ID: Tyler Chase, male    DOB: 10-29-1986, 29 y.o.   MRN: [id number]  HPI GAD- chronic problem, sxs are well controlled on Prozac.  Insomnia- ongoing issue.  Trazodone was ineffective.  Switched to extended release Ambien.  Less groggy when waking up.  Will still have some episodes of early awakening.  Feels like medication is successful but interested in increasing dose.  Tinea versicolor- hx of similar, has used both pills and lotion, 'i hate the lotion'.  Review of Systems For ROS see HPI     Objective:   Physical Exam  Vitals reviewed. Constitutional: He is oriented to person, place, and time. He appears well-developed and well-nourished. No distress.  HENT:  Head: Normocephalic and atraumatic.  Neurological: He is alert and oriented to person, place, and time. No cranial nerve deficit. Coordination normal.  Skin: Skin is warm and dry. Rash (tinea versicolor on flanks bilaterally) noted. No erythema.  Psychiatric: He has a normal mood and affect. His behavior is normal. Thought content normal.          Assessment & Plan:

## 2014-03-10 NOTE — Assessment & Plan Note (Signed)
Stable on Prozac.  No med changes at this time.  Will follow.

## 2014-03-10 NOTE — Progress Notes (Signed)
Pre visit review using our clinic review tool, if applicable. No additional management support is needed unless otherwise documented below in the visit note. 

## 2014-03-10 NOTE — Patient Instructions (Signed)
Schedule your complete physical for Oct Start the Ambien 12.5mg  nightly as needed for insomnia Take the Itraconazole 200mg  daily x7 days Continue the Prozac daily Call with any questions or concerns Happy [birth year] Birth[employer]!

## 2014-03-24 ENCOUNTER — Telehealth: Payer: Self-pay

## 2014-03-24 MED ORDER — ITRACONAZOLE 100 MG PO CAPS: ORAL_CAPSULE | ORAL | Status: DC

## 2014-03-24 NOTE — Telephone Encounter (Signed)
Ok to change to Itraconazole 100mg  2 tabs daily x7 days, #14

## 2014-03-24 NOTE — Telephone Encounter (Signed)
Med filled and pt notified.  

## 2014-03-24 NOTE — Telephone Encounter (Signed)
[facility] [place] called inquiring about a prior authorization for Itraconazole 200 mg.  When reviewing chart it was noted that the [facility] [place].  [facility]ed [facility] [place] and they stated that a prior auth was needed for the brand name version of Itraconazole 200 mg.   Pharmacist stated that the generic, Itraconazole 200 mg, was on back order and the pharmacy only had Itraconazole 100 mg on hand.  He shared that the pharmacy could provide Itraconazole 100 mg and patient could take two.   Please advise.

## 2014-03-31 ENCOUNTER — Telehealth: Payer: Self-pay | Admitting: Family Medicine

## 2014-03-31 NOTE — Telephone Encounter (Addendum)
[facility]ed [facility] to verify need for prior authorization for the Itraconazole 100 mg.  Spoke with [name], pharmacist, who stated that a prior authorization was indeed needed and the number [phone] was given.  Number was called.  Prior authorization was completed.  Medication was approved.  Confirmation is to be sent via fax.  [facility] [place] [place] [facility] made aware of approval.  Tyler Chase was also made aware.  No further questions or concerns voiced.

## 2014-03-31 NOTE — Telephone Encounter (Signed)
4.17.15  This note is in regards to the phone notes from 4.10.15.  Pt is still having issues getting the RX filled by the pharmacy.  Pt states that the pharmacy keeps calling him saying that they keep having to get a prior auth for the medication.  Please contact pt.

## 2014-04-07 ENCOUNTER — Ambulatory Visit (HOSPITAL_BASED_OUTPATIENT_CLINIC_OR_DEPARTMENT_OTHER)
Admission: RE | Admit: 2014-04-07 | Discharge: 2014-04-07 | Disposition: A | Discharge status: Home / Self Care | Payer: BC Managed Care – PPO | Source: Ambulatory Visit | Attending: Family Medicine | Admitting: Family Medicine

## 2014-04-07 ENCOUNTER — Encounter: Payer: Self-pay | Admitting: Family Medicine

## 2014-04-07 ENCOUNTER — Ambulatory Visit (INDEPENDENT_AMBULATORY_CARE_PROVIDER_SITE_OTHER): Payer: BC Managed Care – PPO | Admitting: Family Medicine

## 2014-04-07 VITALS — BP 128/88 | HR 78 | Temp 98.0°F | Resp 16 | Wt 197.2 lb

## 2014-04-07 DIAGNOSIS — M79604 Pain in right leg: Secondary | ICD-10-CM

## 2014-04-07 DIAGNOSIS — M79609 Pain in unspecified limb: Secondary | ICD-10-CM

## 2014-04-07 MED ORDER — NAPROXEN 500 MG PO TABS: 500.0000 mg | ORAL_TABLET | Freq: Two times a day (BID) | ORAL | Status: DC

## 2014-04-07 NOTE — Assessment & Plan Note (Signed)
New.  Pain is unilateral.  Will get Doppler to r/o DVT despite lack of swelling.  Start scheduled NSAIDs.  Refer to sports med for additional evaluation and tx.  Pt expressed understanding and is in agreement w/ plan.

## 2014-04-07 NOTE — Patient Instructions (Signed)
Follow up as needed Alternate heat/ice for pain Start the Naproxen twice daily- take w/ food We'll call you with your ultrasound report We'll call you with your Sports Med appt Call with any questions or concerns Hang in there!!!

## 2014-04-07 NOTE — Progress Notes (Signed)
Pre visit review using our clinic review tool, if applicable. No additional management support is needed unless otherwise documented below in the visit note. 

## 2014-04-07 NOTE — Progress Notes (Signed)
   Subjective:    Patient ID: Tyler Chase, male    DOB: May 22, 1986, 28 y.o.   MRN: [id number]  Leg Pain    R leg pain- sxs 1st started ~1 month ago but have been progressively worsening.  Most painful when driving or w/ prolonged sitting.  Pain starts superior to lateral malleolus and radiates both up and down leg.  No known injury, no change in activity level recently.  No swelling.  No redness.  Not tender to touch.  'it's just a deep pain'.   Review of Systems For ROS see HPI     Objective:   Physical Exam  Vitals reviewed. Constitutional: He appears well-developed and well-nourished. No distress.  Cardiovascular: Intact distal pulses.   Musculoskeletal: He exhibits no edema and no tenderness (no TTP).  R ankle- normal and full ROM  Skin: Skin is warm and dry. No erythema.          Assessment & Plan:

## 2014-04-10 ENCOUNTER — Other Ambulatory Visit (INDEPENDENT_AMBULATORY_CARE_PROVIDER_SITE_OTHER): Payer: BC Managed Care – PPO

## 2014-04-10 ENCOUNTER — Other Ambulatory Visit: Payer: Self-pay | Admitting: Family Medicine

## 2014-04-10 ENCOUNTER — Ambulatory Visit (INDEPENDENT_AMBULATORY_CARE_PROVIDER_SITE_OTHER): Payer: BC Managed Care – PPO | Admitting: Family Medicine

## 2014-04-10 ENCOUNTER — Encounter: Payer: Self-pay | Admitting: Family Medicine

## 2014-04-10 VITALS — BP 118/82 | HR 86 | Ht 73.0 in | Wt 186.0 lb

## 2014-04-10 DIAGNOSIS — M79671 Pain in right foot: Secondary | ICD-10-CM

## 2014-04-10 DIAGNOSIS — M7671 Peroneal tendinitis, right leg: Secondary | ICD-10-CM | POA: Insufficient documentation

## 2014-04-10 DIAGNOSIS — M7750 Other enthesopathy of unspecified foot: Secondary | ICD-10-CM

## 2014-04-10 DIAGNOSIS — M79609 Pain in unspecified limb: Secondary | ICD-10-CM

## 2014-04-10 MED ORDER — MELOXICAM 15 MG PO TABS: 15.0000 mg | ORAL_TABLET | Freq: Every day | ORAL | Status: DC

## 2014-04-10 NOTE — Assessment & Plan Note (Signed)
Patient does have appears to be more of a peroneal tendinitis. Think this is causing him to have some kinetic chain dysfunction and cut and the pain going up his right side. We gave home exercises, icing, and then ten-day course of anti-inflammatories. We discussed potential bracing. Patient will try these interventions and come back again in 3-4 weeks for further evaluation. Patient does have some mild increase in hypermobility of multiple joints that could be contributing to her we may need to monitor.

## 2014-04-10 NOTE — Telephone Encounter (Signed)
Med filled.  

## 2014-04-10 NOTE — Progress Notes (Signed)
[facility] [place], [place] [address] Phone: [phone] Subjective:    I'm seeing this patient by the request  of:  Annye Asa, MD   CC: right leg pain.  [id number] Tyler Chase is a 28 y.o. male coming in with complaint of right leg pain. Patient states he has had this pain for a little for one month. Patient does not remember any true injury to this side of the leg. Patient describes the pain as more the pain it seems actually worse when standing and prolonged sitting position. Patient states it usually happens over the lateral aspect a leg most distally near the ankle and can radiate down towards knee. Patient denies that it is stopping him from any true activities and is able to do all activities of daily living. States the pain severity is 7/10. Denies any nighttime awakening. Does not stop him from any activities.     Past medical history, social, surgical and family history all reviewed in electronic medical record.   Review of Systems: No headache, visual changes, nausea, vomiting, diarrhea, constipation, dizziness, abdominal pain, skin rash, fevers, chills, night sweats, weight loss, swollen lymph nodes, body aches, joint swelling, muscle aches, chest pain, shortness of breath, mood changes.   Objective Blood pressure 118/82, pulse 86, height 6\' 1"  (1.854 m), weight 186 lb (84.369 kg), SpO2 97.00%.  General: No apparent distress alert and oriented x3 mood and affect normal, dressed appropriately.  HEENT: Pupils equal, extraocular movements intact  Respiratory: Patient's speak in full sentences and does not appear short of breath  Cardiovascular: No lower extremity edema, non tender, no erythema  Skin: Warm dry intact with no signs of infection or rash on extremities or on axial skeleton.  Abdomen: Soft nontender  Neuro: Cranial nerves II through XII are intact, neurovascularly intact in all extremities with 2+ DTRs and 2+  pulses.  Lymph: No lymphadenopathy of posterior or anterior cervical chain or axillae bilaterally.  Gait normal with good balance and coordination.  MSK:  Non tender with full range of motion and good stability and symmetric strength and tone of shoulders, elbows, wrist, hip, knees bilaterally. Patient does have some mild increased flexibility multiple joints. Beighton 5-6.  Ankle: Right No visible erythema or swelling. Range of motion is full in all directions. Strength is 5/5 in all directions. Stable lateral and medial ligaments; squeeze test and kleiger test unremarkable; Talar dome nontender; No pain at base of 5th MT; No tenderness over cuboid; No tenderness over N spot or navicular prominence No tenderness on posterior aspects of lateral and medial malleolus Moderate discomfort over the peroneal tendons but no subluxation appreciated. Negative tarsal tunnel tinel's Able to walk 4 steps. Contralateral ankle unremarkable Exam shows the patient does have significant supinated hind foot bilaterally right greater than left otherwise fairly unremarkable.  MSK US performed of: Right ankle This study was ordered, performed, and interpreted by Charlann Boxer D.O.  Foot/Ankle:   All structures visualized.   Talar dome unremarkable  Ankle mortise without effusion. Peroneus longus and brevis tendons does have hypoechoic changes. No tear appreciated. Posterior tibialis, flexor hallucis longus, and flexor digitorum longus tendons unremarkable on long and transverse views without sheath effusions. Achilles tendon visualized along length of tendon and unremarkable on long and transverse views without sheath effusion. Anterior Talofibular Ligament and Calcaneofibular Ligaments unremarkable and intact. Deltoid Ligament unremarkable and intact. Plantar fascia intact and without effusion, normal thickness. No increased doppler signal, cap sign,  or thickening of tibial cortex. Power doppler signal  normal.  IMPRESSION:  Peroneal tendinitis     Impression and Recommendations:     This case required medical decision making of moderate complexity.

## 2014-04-10 NOTE — Patient Instructions (Addendum)
Very nice to meet you Try exercises 4 times a week Ice bath 20 minutes Heel lift in shoe to avoid stretching Other shoes should have rigid sole Tyler Chase, [name], [name] or most tennis shoes) meloxicam daily for 10 days then as needed.  Vitmin d 2000IU daily Come back and see me again in 3-4 weeks.

## 2014-04-14 ENCOUNTER — Encounter: Payer: Self-pay | Admitting: Gastroenterology

## 2014-04-14 ENCOUNTER — Ambulatory Visit (INDEPENDENT_AMBULATORY_CARE_PROVIDER_SITE_OTHER): Payer: BC Managed Care – PPO | Admitting: Gastroenterology

## 2014-04-14 VITALS — BP 104/66 | HR 88 | Ht 71.5 in | Wt 197.0 lb

## 2014-04-14 DIAGNOSIS — K602 Anal fissure, unspecified: Secondary | ICD-10-CM

## 2014-04-14 NOTE — Progress Notes (Signed)
    _                                                                                                                History of Present Illness: Patient status post band ligation x3.  He's having some rectal discomfort when he moves his bowels and may have some spots of blood on the toilet tissue.    Past Medical History  Diagnosis Date  . Depression   . Generalized headaches   . GERD (gastroesophageal reflux disease)   . Anemia   . Anxiety   . Tendonitis    History reviewed. No pertinent past surgical history. family history includes Arthritis in his mother; Heart disease in his maternal grandfather; Hypertension in his maternal grandfather; Lymphoma in his father; Mental illness in his maternal uncle; Migraines in his mother. Current Outpatient Prescriptions  Medication Sig Dispense Refill  . FLUoxetine (PROZAC) 40 MG capsule TAKE ONE CAPSULE BY MOUTH EVERY DAY  30 capsule  2  . itraconazole (SPORANOX) 100 MG capsule Take 2 caps daily x 7 days  14 capsule  0  . meloxicam (MOBIC) 15 MG tablet Take 1 tablet (15 mg total) by mouth daily.  30 tablet  0  . pantoprazole (PROTONIX) 40 MG tablet Take 40 mg by mouth daily as needed.      . topiramate (TOPAMAX) 25 MG capsule Take 25 mg by mouth 3 (three) times daily.      Marland Kitchen zolpidem (AMBIEN CR) 12.5 MG CR tablet Take 1 tablet (12.5 mg total) by mouth at bedtime as needed for sleep.  30 tablet  3   No current facility-administered medications for this visit.   Allergies as of 04/14/2014  . (No Known Allergies)    reports that he has never smoked. He has never used smokeless tobacco. He reports that he drinks alcohol. He reports that he does not use illicit drugs.     Review of Systems: Pertinent positive and negative review of systems were noted in the above HPI section. All other review of systems were otherwise negative.  Vital signs were reviewed in today's medical record Physical Exam: General: Well developed , well  nourished, no acute distress On rectal exam there is a fissure at the posterior midline  See Assessment and Plan under Problem List

## 2014-04-14 NOTE — Assessment & Plan Note (Signed)
The patient has a recurrent anal fissure  Recommendations #1 nitroglycerin ointment 0.125% 3 times a day along with fiber supplementation

## 2014-04-14 NOTE — Patient Instructions (Signed)
Anal Fissure, Adult An anal fissure is a small tear or crack in the skin around the anus. Bleeding from a fissure usually stops on its own within a few minutes. However, bleeding will often reoccur with each bowel movement until the crack heals.  CAUSES   Passing large, hard stools.  Frequent diarrheal stools.  Constipation.  Inflammatory bowel disease (Crohn's disease or ulcerative colitis).  Infections.  Anal sex. SYMPTOMS   Small amounts of blood seen on your stools, on toilet paper, or in the toilet after a bowel movement.  Rectal bleeding.  Painful bowel movements.  Itching or irritation around the anus. DIAGNOSIS Your caregiver will examine the anal area. An anal fissure can usually be seen with careful inspection. A rectal exam may be performed and a short tube (anoscope) may be used to examine the anal canal. TREATMENT   You may be instructed to take fiber supplements. These supplements can soften your stool to help make bowel movements easier.  Sitz baths may be recommended to help heal the tear. Do not use soap in the sitz baths.  A medicated cream or ointment may be prescribed to lessen discomfort. HOME CARE INSTRUCTIONS   Maintain a diet high in fruits, whole grains, and vegetables. Avoid constipating foods like bananas and dairy products.  Take sitz baths as directed by your caregiver.  Drink enough fluids to keep your urine clear or pale yellow.  Only take over-the-counter or prescription medicines for pain, discomfort, or fever as directed by your caregiver. Do not take aspirin as this may increase bleeding.  Do not use ointments containing numbing medications (anesthetics) or hydrocortisone. They could slow healing. SEEK MEDICAL CARE IF:   Your fissure is not completely healed within 3 days.  You have further bleeding.  You have a fever.  You have diarrhea mixed with blood.  You have pain.  Your problem is getting worse rather than  better. MAKE SURE YOU:   Understand these instructions.  Will watch your condition.  Will get help right away if you are not doing well or get worse. Document Released: 12/01/2005 Document Revised: 02/23/2012 Document Reviewed: 05/18/2011 [facility] Patient Information 2014 [place], [place].   Follow up in 44months

## 2014-05-01 ENCOUNTER — Ambulatory Visit: Payer: BC Managed Care – PPO | Admitting: Family Medicine

## 2014-05-04 ENCOUNTER — Encounter: Payer: Self-pay | Admitting: Family Medicine

## 2014-05-04 ENCOUNTER — Other Ambulatory Visit (INDEPENDENT_AMBULATORY_CARE_PROVIDER_SITE_OTHER): Payer: BC Managed Care – PPO

## 2014-05-04 ENCOUNTER — Ambulatory Visit (INDEPENDENT_AMBULATORY_CARE_PROVIDER_SITE_OTHER): Payer: BC Managed Care – PPO | Admitting: Family Medicine

## 2014-05-04 VITALS — BP 112/82 | HR 83 | Ht 73.0 in | Wt 196.0 lb

## 2014-05-04 DIAGNOSIS — M79671 Pain in right foot: Secondary | ICD-10-CM

## 2014-05-04 DIAGNOSIS — M79609 Pain in unspecified limb: Secondary | ICD-10-CM

## 2014-05-04 DIAGNOSIS — M7750 Other enthesopathy of unspecified foot: Secondary | ICD-10-CM

## 2014-05-04 DIAGNOSIS — M7671 Peroneal tendinitis, right leg: Secondary | ICD-10-CM

## 2014-05-04 NOTE — Progress Notes (Signed)
[facility] [place], [place] [address] Phone: [phone] Subjective:     CC: right leg pain follow up  [id number] Tyler Chase is a 28 y.o. male coming in with complaint of right leg pain. Patient was seen previously and was diagnosed with peroneal tendinitis of the right lower extremity. Patient is given home exercise program, medications, ice and protocol and heel lift.  Patient states  he is doing 60-80% better. Patient states he still has a mild aching on the lateral aspect but no pain going up the leg and more. Patient has not been running but has been doing exercises very regularly. Patient denies any nighttime awakening is not taking any medications.    Past medical history, social, surgical and family history all reviewed in electronic medical record.   Review of Systems: No headache, visual changes, nausea, vomiting, diarrhea, constipation, dizziness, abdominal pain, skin rash, fevers, chills, night sweats, weight loss, swollen lymph nodes, body aches, joint swelling, muscle aches, chest pain, shortness of breath, mood changes.   Objective Blood pressure 112/82, pulse 83, height 6\' 1"  (1.854 m), weight 196 lb (88.905 kg), SpO2 97.00%.  General: No apparent distress alert and oriented x3 mood and affect normal, dressed appropriately.  HEENT: Pupils equal, extraocular movements intact  Respiratory: Patient's speak in full sentences and does not appear short of breath  Cardiovascular: No lower extremity edema, non tender, no erythema  Skin: Warm dry intact with no signs of infection or rash on extremities or on axial skeleton.  Abdomen: Soft nontender  Neuro: Cranial nerves II through XII are intact, neurovascularly intact in all extremities with 2+ DTRs and 2+ pulses.  Lymph: No lymphadenopathy of posterior or anterior cervical chain or axillae bilaterally.  Gait normal with good balance and coordination.  MSK:  Non tender with  full range of motion and good stability and symmetric strength and tone of shoulders, elbows, wrist, hip, knees bilaterally. Patient does have some mild increased flexibility multiple joints. Beighton 5-6.  Ankle: Right No visible erythema or swelling. Range of motion is full in all directions. Strength is 5/5 in all directions. Stable lateral and medial ligaments; squeeze test and kleiger test unremarkable; Talar dome nontender; No pain at base of 5th MT; No tenderness over cuboid; No tenderness over N spot or navicular prominence No tenderness on posterior aspects of lateral and medial malleolus Nontender over the peroneal tendons today. Negative tarsal tunnel tinel's Able to walk 4 steps. Contralateral ankle unremarkable Exam shows the patient does have significant supinated hind foot bilaterally right greater than left otherwise fairly unremarkable.  MSK US performed of: Right ankle This study was ordered, performed, and interpreted by Charlann Boxer D.O.  Foot/Ankle:   All structures visualized.   Talar dome unremarkable  Ankle mortise without effusion. Peroneus longus and brevis tendons has no significant hypoechoic changes. No signs of subluxation. Posterior tibialis, flexor hallucis longus, and flexor digitorum longus tendons unremarkable on long and transverse views without sheath effusions. Achilles tendon visualized along length of tendon and unremarkable on long and transverse views without sheath effusion. Anterior Talofibular Ligament and Calcaneofibular Ligaments unremarkable and intact. Deltoid Ligament unremarkable and intact. Plantar fascia intact and without effusion, normal thickness. No increased doppler signal, cap sign, or thickening of tibial cortex. Power doppler signal normal.  IMPRESSION:  Peroneal tendinitis improve     Impression and Recommendations:     This case required medical decision making of moderate complexity.

## 2014-05-04 NOTE — Patient Instructions (Signed)
Good to see you New balance, [employer] and [employer]idas or other nuetral running shoes would be good.  Continue the heel lift for now.  [email or web address]  X link ankle compression size medium. Wear with running.  Continue icing after running.  Try topical and see if you like. Can use 2 times daily can call me if you want your own prescription.  Start a walk-run progression: - I would like you to do line drills (try to keep foot on a line when jogging). - Initially start one minute walking than one minute running for 20 mins in the first week,   then 25 mins during the second week, then 30 mins afterwards.  Once you have reached 30 mins: - Run 2 mins, then walk 1 min. -Then run 3 mins, and walk 1 min. -Then run 4 mins, and walk 1 min. -Then run 5 mins, and walk 1 min. -Slowly build up weekly to running 30 mins nonstop.  If painful at any of the steps, back up one step.  Come back in 4 weeks.

## 2014-05-04 NOTE — Assessment & Plan Note (Signed)
Patient is doing remarkable better at this time. Patient's ultrasound today show significant improvement. Patient was told that a compression sleeve with running that could be beneficial. We discussed continuing the icing program and was given a trial of some topical anti-inflammatory. Patient will start a running progression of this time and will followup again in 4 weeks for further evaluation. We also discussed proper shoe choices.  Spent greater than 25 minutes with patient face-to-face and had greater than 50% of counseling including as described above in assessment and plan.

## 2014-07-19 ENCOUNTER — Other Ambulatory Visit: Payer: Self-pay | Admitting: Family Medicine

## 2014-07-19 NOTE — Telephone Encounter (Signed)
Med filled.  

## 2014-08-09 ENCOUNTER — Telehealth: Payer: Self-pay | Admitting: Family Medicine

## 2014-08-09 DIAGNOSIS — R51 Headache: Secondary | ICD-10-CM

## 2014-08-09 NOTE — Telephone Encounter (Signed)
Referral placed.

## 2014-08-09 NOTE — Telephone Encounter (Signed)
Ok for referral?

## 2014-08-09 NOTE — Telephone Encounter (Signed)
Caller name: Kinte  Relation to pt: self  Call back number:  Pharmacy:  Reason for call: pt requesting a referral for a neurologist. Previous neuro has retried.

## 2014-08-11 ENCOUNTER — Ambulatory Visit: Payer: BC Managed Care – PPO | Admitting: Family Medicine

## 2014-08-17 ENCOUNTER — Encounter: Payer: Self-pay | Admitting: Neurology

## 2014-08-17 ENCOUNTER — Ambulatory Visit (INDEPENDENT_AMBULATORY_CARE_PROVIDER_SITE_OTHER): Payer: BC Managed Care – PPO | Admitting: Neurology

## 2014-08-17 VITALS — BP 118/68 | HR 74 | Temp 97.8°F | Resp 20 | Ht 73.0 in | Wt 206.1 lb

## 2014-08-17 DIAGNOSIS — G4761 Periodic limb movement disorder: Secondary | ICD-10-CM

## 2014-08-17 DIAGNOSIS — R251 Tremor, unspecified: Secondary | ICD-10-CM

## 2014-08-17 DIAGNOSIS — R259 Unspecified abnormal involuntary movements: Secondary | ICD-10-CM

## 2014-08-17 DIAGNOSIS — G2581 Restless legs syndrome: Secondary | ICD-10-CM

## 2014-08-17 DIAGNOSIS — G43109 Migraine with aura, not intractable, without status migrainosus: Secondary | ICD-10-CM

## 2014-08-17 MED ORDER — TOPIRAMATE 25 MG PO CPSP: 75.0000 mg | ORAL_CAPSULE | Freq: Every day | ORAL | Status: DC

## 2014-08-17 NOTE — Progress Notes (Addendum)
NEUROLOGY CONSULTATION NOTE  Tyler Chase MRN: [id number] DOB: 1986/11/21  Referring provider: Dr. Birdie Riddle Primary care provider: Dr. Birdie Riddle  Reason for consult:  Migraine, restless leg, tremor  HISTORY OF PRESENT ILLNESS: Tyler Chase is a 28 year old right handed man with history of depression, migraine, tremor and eye disorder who presents for headache and restless leg syndrome.  Records reviewed.  HEADACHE: Onset:  28 years old following a roller coaster ride. Location:  Bilateral retro-orbital and frontal, travels to the back of the head Quality:  pounding Intensity:  8-9/10 Aura:  Visual scotoma which spreads Prodrome:  Mild nausea Associated symptoms:  Nausea, vomiting, photophobia.  No phonophobia, osmophobia or autonomic symptoms. Duration:  3-4 hours Frequency:  Last migraine was two years ago Triggers/exacerbating factors:  Nitrates, sulfides, stress, lack of sleep Relieving factors:  sleep Activity:  Cannot function.  Needs to lay down and sleep  Past abortive therapy:  Ibuprofen, probable Imitrex shot (effective), sumatriptan (effective) Past preventative therapy:  uncertain  Current abortive therapy:  none Current preventative therapy:  topiramate 75mg  Other medications:  Ambien CR 12.5mg  (rarely uses), fluoxetine 40mg   Caffeine:  1 cup coffee daily Alcohol:  1x/week Smoker:  no Diet:  good Exercise:  Not regularly Depression/stress:  controlled Sleep hygiene:  poor Family history of headache:  Mom, sisters  RESTLESS LEG He has a history of peroneal tendonitis of the right leg.  He had been seen and treated by PM&R.  For several months, he reports unintentional shaking and movement of his legs.  It occurs in bed only when his thoughts are racing.  There is some discomfort but it is not the typical presentation of discomfort with need to move the legs for relief of symptoms.  His boyfriend also noted that on a couple of occasions, his legs would kick  while he was asleep.  Labs from November 2014 include iron 181, ferritin 53.6, B12 597, and folate >24.8.  He believes it has gotten a little better since starting fluoxetine.  TREMOR For several years, he has had mild tremor of the hands, most noticeable when he is using his hand, such as writing.  He is a Geophysicist/field seismologist, and sometimes notices it when using his camera.  It is not at rest.  There is no family history of tremor.  He thinks it may have gotten a little worse but it doesn't really bother him for now.  PAST MEDICAL HISTORY: Past Medical History  Diagnosis Date  . Depression   . Generalized headaches   . GERD (gastroesophageal reflux disease)   . Anemia   . Anxiety   . Tendonitis     PAST SURGICAL HISTORY: No past surgical history on file.  MEDICATIONS: Current Outpatient Prescriptions on File Prior to Visit  Medication Sig Dispense Refill  . FLUoxetine (PROZAC) 40 MG capsule TAKE ONE CAPSULE BY MOUTH EVERY DAY  30 capsule  3  . meloxicam (MOBIC) 15 MG tablet Take 1 tablet (15 mg total) by mouth daily.  30 tablet  0  . pantoprazole (PROTONIX) 40 MG tablet Take 40 mg by mouth daily as needed.      . zolpidem (AMBIEN CR) 12.5 MG CR tablet Take 1 tablet (12.5 mg total) by mouth at bedtime as needed for sleep.  30 tablet  3  . itraconazole (SPORANOX) 100 MG capsule Take 2 caps daily x 7 days  14 capsule  0   No current facility-administered medications on file prior to visit.  ALLERGIES: No Known Allergies  FAMILY HISTORY: Family History  Problem Relation Age of Onset  . Arthritis Mother   . Lymphoma Father   . Mental illness Maternal Uncle     great uncle  . Heart disease Maternal Grandfather   . Hypertension Maternal Grandfather   . Migraines Mother     SOCIAL HISTORY: History   Social History  . Marital Status: Single    Spouse Name: N/A    Number of Children: 0  . Years of Education: N/A   Occupational History  . teacher    Social History Main  Topics  . Smoking status: Never Smoker   . Smokeless tobacco: Never Used  . Alcohol Use: Yes     Comment: social  . Drug Use: No  . Sexual Activity: Yes    Partners: Female   Other Topics Concern  . Not on file   Social History Narrative  . No narrative on file    REVIEW OF SYSTEMS: Constitutional: No fevers, chills, or sweats, no generalized fatigue, change in appetite Eyes: No visual changes, double vision, eye pain Ear, nose and throat: No hearing loss, ear pain, nasal congestion, sore throat Cardiovascular: No chest pain, palpitations Respiratory:  No shortness of breath at rest or with exertion, wheezes GastrointestinaI: No nausea, vomiting, diarrhea, abdominal pain, fecal incontinence Genitourinary:  No dysuria, urinary retention or frequency Musculoskeletal:  No neck pain, back pain Integumentary: No rash, pruritus, skin lesions Neurological: as above Psychiatric: depression, insomnia, anxiety Endocrine: No palpitations, fatigue, diaphoresis, mood swings, change in appetite, change in weight, increased thirst Hematologic/Lymphatic:  No anemia, purpura, petechiae. Allergic/Immunologic: no itchy/runny eyes, nasal congestion, recent allergic reactions, rashes  PHYSICAL EXAM: Filed Vitals:   08/17/14 1104  BP: 118/68  Pulse: 74  Temp: 97.8 F (36.6 C)  Resp: 20   General: No acute distress Head:  Normocephalic/atraumatic Neck: supple, no paraspinal tenderness, full range of motion Back: No paraspinal tenderness Heart: regular rate and rhythm Lungs: Clear to auscultation bilaterally. Vascular: No carotid bruits. Neurological Exam: Mental status: alert and oriented to person, place, and time, recent and remote memory intact, fund of knowledge intact, attention and concentration intact, speech fluent and not dysarthric, language intact. Cranial nerves: CN I: not tested CN II: pupils equal, round and reactive to light, visual fields intact, fundi unremarkable,  without vessel changes, exudates, hemorrhages or papilledema. CN III, IV, VI:  Left eye abducted on primary gaze but full range of motion, no nystagmus, no ptosis CN V: facial sensation intact CN VII: upper and lower face symmetric CN VIII: hearing intact CN IX, X: gag intact, uvula midline CN XI: sternocleidomastoid and trapezius muscles intact CN XII: tongue midline Bulk & Tone: normal, no fasciculations. Motor:  Sensation: temperature and vibration intact Deep Tendon Reflexes: 2+ throughout, toes downgoing Finger to nose testing: Fine postural and intention tremor. Heel to shin: no dysmetria Gait: normal station and stride.  Able to turn and walk in tandem. Romberg negative.  IMPRESSION: Migraine with aura, controlled Restless leg symptoms, more likely related to anxiety rather than restless leg syndrome as symptoms are not classic for the RLS and SSRIs typically worsen restless leg symptoms, where he has noted some improvement on it. Periodic limb movements of sleep Essential tremor  PLAN: Refilled topamax 75mg  at bedtime Continue fluoxetine for depression (if restless leg symptoms worsen, consider switching antidepressant to something other than SSRI) Monitor tremor. If it becomes a problem, consider treatment. Follow up in 6 months.  Thank  you for allowing me to take part in the care of this patient.  Metta Clines, DO  CC:  Annye Asa, MD

## 2014-08-17 NOTE — Patient Instructions (Signed)
1.  I refilled the topamax (75mg  at bedtime). 2.  I think the restless leg more likely is due to anxiety rather than restless leg syndrome (especially since it has somewhat improved on Prozac).  I would continue to monitor. 3.  There are medication we can try for the tremor.  However, I would hold off on it unless the tremor becomes a problem. 4.  Follow up in 6 months.

## 2014-11-24 ENCOUNTER — Other Ambulatory Visit: Payer: Self-pay | Admitting: General Practice

## 2014-11-24 MED ORDER — FLUOXETINE HCL 40 MG PO CAPS: 40.0000 mg | ORAL_CAPSULE | Freq: Every day | ORAL | Status: DC

## 2014-12-11 ENCOUNTER — Ambulatory Visit (INDEPENDENT_AMBULATORY_CARE_PROVIDER_SITE_OTHER): Payer: BC Managed Care – PPO | Admitting: Family Medicine

## 2014-12-11 ENCOUNTER — Encounter: Payer: Self-pay | Admitting: Family Medicine

## 2014-12-11 VITALS — BP 112/65 | HR 86 | Temp 98.3°F | Resp 16 | Ht 73.0 in | Wt 207.1 lb

## 2014-12-11 DIAGNOSIS — F411 Generalized anxiety disorder: Secondary | ICD-10-CM

## 2014-12-11 DIAGNOSIS — G4700 Insomnia, unspecified: Secondary | ICD-10-CM

## 2014-12-11 MED ORDER — FLUOXETINE HCL 40 MG PO CAPS: 80.0000 mg | ORAL_CAPSULE | Freq: Every day | ORAL | Status: DC

## 2014-12-11 MED ORDER — ZOLPIDEM TARTRATE ER 12.5 MG PO TBCR: 12.5000 mg | EXTENDED_RELEASE_TABLET | Freq: Every evening | ORAL | Status: DC | PRN

## 2014-12-11 NOTE — Progress Notes (Signed)
Pre visit review using our clinic review tool, if applicable. No additional management support is needed unless otherwise documented below in the visit note/SLS  

## 2014-12-11 NOTE — Patient Instructions (Signed)
Follow up in 4-6 weeks to recheck mood Increase the Prozac to 80mg  daily (2 tabs daily) Use the Ambien as needed for sleep Try and find a stress outlet- art, exercise, journaling, etc Call with any questions or concerns Happy New Year!!

## 2014-12-11 NOTE — Progress Notes (Signed)
   Subjective:    Patient ID: Tyler Chase, male    DOB: 1986/07/29, 29 y.o.   MRN: [id number]  HPI Depression/Anxiety- pt feels that recently sxs have worsened despite the Prozac.  For 2-3 months pt has been having 'bouts of anxiety'.  Sleeping has not improve- waking between 3-5am.  Was on Trazodone previously and this was ineffective.  Had script for Ambien previously but has not taken recently.  Pt took Wellbutrin years ago and found that this caused increased anxiety and agitation.   Review of Systems For ROS see HPI     Objective:   Physical Exam  Constitutional: He is oriented to person, place, and time. He appears well-developed and well-nourished. No distress.  HENT:  Head: Normocephalic and atraumatic.  Neurological: He is alert and oriented to person, place, and time.  Psychiatric: He has a normal mood and affect. His behavior is normal. Thought content normal.  Vitals reviewed.         Assessment & Plan:

## 2014-12-11 NOTE — Assessment & Plan Note (Signed)
Deteriorated due to recent life events (pt not willing to share).  Increase Prozac to 80mg  as pt has had side effects on Wellbutrin previously.  Will also attempt to treat poor sleep.  Will follow.

## 2014-12-11 NOTE — Assessment & Plan Note (Signed)
Chronic problem.  Pt has been taking OTC sleep meds w/ some relief but he is still having early morning awakening w/ racing thoughts.  Suspect this is due to undertreated anxiety.  Will treat both sleep and anxiety and continue to monitor.

## 2015-01-19 ENCOUNTER — Ambulatory Visit (INDEPENDENT_AMBULATORY_CARE_PROVIDER_SITE_OTHER): Payer: BC Managed Care – PPO | Admitting: Family Medicine

## 2015-01-19 ENCOUNTER — Encounter: Payer: Self-pay | Admitting: Family Medicine

## 2015-01-19 VITALS — BP 118/76 | HR 77 | Temp 98.2°F | Resp 16 | Wt 203.2 lb

## 2015-01-19 DIAGNOSIS — Z202 Contact with and (suspected) exposure to infections with a predominantly sexual mode of transmission: Secondary | ICD-10-CM | POA: Insufficient documentation

## 2015-01-19 DIAGNOSIS — F411 Generalized anxiety disorder: Secondary | ICD-10-CM

## 2015-01-19 LAB — HIV ANTIBODY (ROUTINE TESTING W REFLEX): HIV: NONREACTIVE

## 2015-01-19 LAB — RPR

## 2015-01-19 MED ORDER — SERTRALINE HCL 100 MG PO TABS: 100.0000 mg | ORAL_TABLET | Freq: Every day | ORAL | Status: DC

## 2015-01-19 NOTE — Assessment & Plan Note (Signed)
New.  Will check labs at pt's request.

## 2015-01-19 NOTE — Patient Instructions (Signed)
Follow up by phone or MyChart in 3-4 weeks to let me know how the Zoloft is working STOP the Prozac START the Zoloft nightly We'll notify you of your lab results and make any changes if needed Call with any questions or concerns Happy Valentine's Day!

## 2015-01-19 NOTE — Progress Notes (Signed)
Pre visit review using our clinic review tool, if applicable. No additional management support is needed unless otherwise documented below in the visit note. 

## 2015-01-19 NOTE — Progress Notes (Signed)
   Subjective:    Patient ID: Tyler Chase, male    DOB: [birth year], 29 y.o.   MRN: [id number]  HPI Anxiety- pt's Prozac was increased to 80mg  at last visit.  Had 1 week of increased depression after increasing meds but this has improved.  Anxiety has improved.  Previously on Wellbutrin w/ increased anxiety.  Feels like Celexa sounds familiar but doesn't recall his experience on this.  Pt also has hx of poor sleep.  Possible STD exposure- pt desires STD testing   Review of Systems For ROS see HPI   Reviewed meds, allergies, problem list, and PMH in chart     Objective:   Physical Exam  Constitutional: He is oriented to person, place, and time. He appears well-developed and well-nourished. No distress.  HENT:  Head: Normocephalic and atraumatic.  Eyes: EOM are normal. Pupils are equal, round, and reactive to light.  Neurological: He is alert and oriented to person, place, and time. Coordination normal.  Skin: Skin is warm and dry.  Psychiatric: He has a normal mood and affect. His behavior is normal. Judgment and thought content normal.  Vitals reviewed.         Assessment & Plan:

## 2015-01-19 NOTE — Assessment & Plan Note (Signed)
Pt's anxiety is better but the increased Prozac worsened his depression.  Based on this, will switch to Zoloft as pt also has sleep issues.  Pt will follow up by phone or MyChart in 3-4 weeks to determine if dose adjustment is needed.  Will follow closely.

## 2015-02-16 ENCOUNTER — Ambulatory Visit: Payer: BC Managed Care – PPO | Admitting: Neurology

## 2015-03-06 ENCOUNTER — Encounter: Payer: Self-pay | Admitting: Physician Assistant

## 2015-03-06 ENCOUNTER — Ambulatory Visit (INDEPENDENT_AMBULATORY_CARE_PROVIDER_SITE_OTHER): Payer: BC Managed Care – PPO | Admitting: Physician Assistant

## 2015-03-06 VITALS — BP 129/76 | HR 76 | Temp 98.7°F | Resp 16 | Ht 73.0 in | Wt 195.5 lb

## 2015-03-06 DIAGNOSIS — B360 Pityriasis versicolor: Secondary | ICD-10-CM

## 2015-03-06 DIAGNOSIS — R309 Painful micturition, unspecified: Secondary | ICD-10-CM

## 2015-03-06 LAB — POCT URINALYSIS DIPSTICK
BILIRUBIN UA: NEGATIVE
Bilirubin, UA: NEGATIVE
Blood, UA: NEGATIVE
Blood, UA: NEGATIVE
GLUCOSE UA: NEGATIVE
Glucose, UA: NEGATIVE
KETONES UA: NEGATIVE
Ketones, UA: NEGATIVE
LEUKOCYTES UA: NEGATIVE
Leukocytes, UA: NEGATIVE
NITRITE UA: NEGATIVE
Nitrite, UA: NEGATIVE
PROTEIN UA: NEGATIVE
Protein, UA: NEGATIVE
Spec Grav, UA: 1.015
Spec Grav, UA: 1.015
UROBILINOGEN UA: 0.2
UROBILINOGEN UA: 0.2
pH, UA: 6
pH, UA: 6

## 2015-03-06 MED ORDER — ITRACONAZOLE 100 MG PO CAPS: 100.0000 mg | ORAL_CAPSULE | Freq: Two times a day (BID) | ORAL | Status: DC

## 2015-03-06 NOTE — Progress Notes (Signed)
   Subjective:    Patient ID: Tyler Chase, male    DOB: 1986/03/12, 29 y.o.   MRN: [id number]  HPI  Patient presents to the office today with two complaints.   1) rash on ichest and back. Per patient he gets the rash every year ( for the last 6-7 years) at the same time each year. He has been diagnosed with Tinea Versicolor. Has used a cream and pill in the past, but does not like the cream.   2) Intermittant burning pain with urination for the last six months. Per patient he gets the pain at the end of his stream and it happens 2-3 times per week. Denies any discharge, denies any unsafe sex practices. Denies fever, chills. Endorses that he feels as though he cannot empty his bladder and that he has to go frequently.   Review of Systems  Constitutional: Negative for fever, chills, activity change, appetite change and fatigue.  Gastrointestinal: Negative for nausea, vomiting, abdominal pain, diarrhea, constipation, blood in stool, abdominal distention and rectal pain.  Genitourinary: Positive for dysuria, urgency, frequency and penile pain. Negative for hematuria, flank pain, decreased urine volume, discharge, penile swelling, scrotal swelling, enuresis, difficulty urinating and testicular pain.  Skin: Positive for rash. Negative for color change and pallor.       Objective:   Physical Exam  Constitutional: He is oriented to person, place, and time. He appears well-developed and well-nourished. No distress.  Cardiovascular: Normal rate and normal heart sounds.  Exam reveals no gallop and no friction rub.   No murmur heard. Abdominal: He exhibits no distension and no mass. There is no tenderness. There is no rebound and no guarding.  Bladder pressure with palpation  Genitourinary: Penis normal. No penile tenderness.  No discharge, no yeast  Neurological: He is alert and oriented to person, place, and time.  Skin: Skin is warm and dry. Rash noted.   light brown hyperpigmented plaque  like patches scattered throughout trunk   Psychiatric: He has a normal mood and affect.          Assessment & Plan:  Tinea Versicolor - Prescribed Itraconazole 100mg  BID for seven days as this has been what has worked for the patient in the past.  - Told to follow up if not improved in 3-4 days  Urinary symptoms - Sent UC & GC - Amb. Referral to urology - Follow up if symptoms worsen.  - Will update patient on labs

## 2015-03-06 NOTE — Addendum Note (Signed)
Addended by: Rockwell Germany on: 03/06/2015 04:41 PM   Modules accepted: Orders

## 2015-03-06 NOTE — Progress Notes (Signed)
Pre visit review using our clinic review tool, if applicable. No additional management support is needed unless otherwise documented below in the visit note/SLS  

## 2015-03-06 NOTE — Patient Instructions (Addendum)
Please follow up with urology. A referral has been put in and they will be calling you to make an appointment - We will let yo know if your culture comes back positive. Results are usually available within 48 hours.  - Prescription Itraconazole has been sent to your pharmacy. Take one pill twice a day for seven days. - If the rash has not improving in 3-4 days, please let us know.  - I hope you feel better and have a great birthday!   Tinea Versicolor Tinea versicolor is a common yeast infection of the skin. This condition becomes known when the yeast on our skin starts to overgrow (yeast is a normal inhabitant on our skin). This condition is noticed as white or light brown patches on brown skin, and is more evident in the summer on tanned skin. These areas are slightly scaly if scratched. The light patches from the yeast become evident when the yeast creates "holes in your suntan". This is most often noticed in the summer. The patches are usually located on the chest, back, pubis, neck and body folds. However, it may occur on any area of body. Mild itching and inflammation (redness or soreness) may be present. DIAGNOSIS  The diagnosisof this is made clinically (by looking). Cultures from samples are usually not needed. Examination under the microscope may help. However, yeast is normally found on skin. The diagnosis still remains clinical. Examination under Wood's Ultraviolet Light can determine the extent of the infection. TREATMENT  This common infection is usually only of cosmetic (only a concern to your appearance). It is easily treated with dandruff shampoo used during showers or bathing. Vigorous scrubbing will eliminate the yeast over several days time. The light areas in your skin may remain for weeks or months after the infection is cured unless your skin is exposed to sunlight. The lighter or darker spots caused by the fungus that remain after complete treatment are not a sign of treatment  failure; it will take a long time to resolve. Your caregiver may recommend a number of commercial preparations or medication by mouth if home care is not working. Recurrence is common and preventative medication may be necessary. This skin condition is not highly contagious. Special care is not needed to protect close friends and family members. Normal hygiene is usually enough. Follow up is required only if you develop complications (such as a secondary infection from scratching), if recommended by your caregiver, or if no relief is obtained from the preparations used. Document Released: 11/28/2000 Document Revised: 02/23/2012 Document Reviewed: 01/10/2009 [facility] Patient Information 2015 [place], [place]. This information is not intended to replace advice given to you by your health care provider. Make sure you discuss any questions you have with your health care provider. Ve

## 2015-03-06 NOTE — Addendum Note (Signed)
Addended by: Leticia Penna A on: 03/06/2015 04:24 PM   Modules accepted: Orders

## 2015-03-07 LAB — GC/CHLAMYDIA PROBE AMP, URINE
CHLAMYDIA, SWAB/URINE, PCR: NEGATIVE
GC Probe Amp, Urine: NEGATIVE

## 2015-03-09 LAB — CULTURE, URINE COMPREHENSIVE
Colony Count: NO GROWTH
Organism ID, Bacteria: NO GROWTH

## 2015-03-13 ENCOUNTER — Encounter: Payer: Self-pay | Admitting: Family Medicine

## 2015-05-09 ENCOUNTER — Ambulatory Visit (INDEPENDENT_AMBULATORY_CARE_PROVIDER_SITE_OTHER): Payer: BC Managed Care – PPO | Admitting: Medical

## 2015-05-09 ENCOUNTER — Encounter: Payer: Self-pay | Admitting: Medical

## 2015-05-09 VITALS — BP 120/80 | HR 76 | Temp 98.6°F | Wt 205.4 lb

## 2015-05-09 DIAGNOSIS — J301 Allergic rhinitis due to pollen: Secondary | ICD-10-CM

## 2015-05-09 DIAGNOSIS — J309 Allergic rhinitis, unspecified: Secondary | ICD-10-CM | POA: Insufficient documentation

## 2015-05-09 DIAGNOSIS — H6690 Otitis media, unspecified, unspecified ear: Secondary | ICD-10-CM | POA: Insufficient documentation

## 2015-05-09 DIAGNOSIS — H6500 Acute serous otitis media, unspecified ear: Secondary | ICD-10-CM | POA: Diagnosis not present

## 2015-05-09 MED ORDER — MOMETASONE FUROATE 50 MCG/ACT NA SUSP: NASAL | Status: DC

## 2015-05-09 MED ORDER — LORATADINE 10 MG PO TABS: 10.0000 mg | ORAL_TABLET | Freq: Every day | ORAL | Status: DC

## 2015-05-09 MED ORDER — AZITHROMYCIN 250 MG PO TABS: ORAL_TABLET | ORAL | Status: DC

## 2015-05-09 NOTE — Assessment & Plan Note (Signed)
Rt side following recent nasal congestion. Lt ear pressure likely eustachian tube dysfunction. Rx azithromycin for OM. Nasonex for ear pressure.  Stop cipro antibiotic next 7 days while on cipro.

## 2015-05-09 NOTE — Progress Notes (Signed)
Subjective:    Patient ID: Tyler Chase, male    DOB: [birth year], 29 y.o.   MRN: [id number]  HPI  Cough persistent for about 2 wks. He states seemed to following congestion and sneezing. Does have some pnd. Recent mild left ear pressure. Denies any chronic allergies. No obvious sinus pressure. Mild faint sore throat. Cough intermittent dry then at time productive cough.   Review of Systems  Constitutional: Negative for fever, chills, diaphoresis, activity change and fatigue.  HENT: Positive for congestion, ear pain and sore throat. Negative for sinus pressure.        Lt ear pressure.  Respiratory: Positive for cough. Negative for chest tightness, shortness of breath and wheezing.   Cardiovascular: Negative for chest pain, palpitations and leg swelling.  Gastrointestinal: Negative for nausea, vomiting and abdominal pain.  Musculoskeletal: Negative for neck pain and neck stiffness.  Neurological: Negative for dizziness, tremors, seizures, syncope, facial asymmetry, speech difficulty, weakness, light-headedness, numbness and headaches.  Psychiatric/Behavioral: Negative for behavioral problems, confusion and agitation. The patient is not nervous/anxious.     Past Medical History  Diagnosis Date  . Depression   . Generalized headaches   . GERD (gastroesophageal reflux disease)   . Anemia   . Anxiety   . Tendonitis     History   Social History  . Marital Status: Single    Spouse Name: N/A  . Number of Children: 0  . Years of Education: N/A   Occupational History  . teacher    Social History Main Topics  . Smoking status: Never Smoker   . Smokeless tobacco: Never Used  . Alcohol Use: Yes     Comment: social  . Drug Use: No  . Sexual Activity:    Partners: Female   Other Topics Concern  . Not on file   Social History Narrative    No past surgical history on file.  Family History  Problem Relation Age of Onset  . Arthritis Mother   . Lymphoma Father   .  Mental illness Maternal Uncle     great uncle  . Heart disease Maternal Grandfather   . Hypertension Maternal Grandfather   . Migraines Mother     No Known Allergies  Current Outpatient Prescriptions on File Prior to Visit  Medication Sig Dispense Refill  . sertraline (ZOLOFT) 100 MG tablet Take 1 tablet (100 mg total) by mouth daily. 30 tablet 3  . topiramate (TOPAMAX) 25 MG capsule Take 3 capsules (75 mg total) by mouth at bedtime. 90 capsule 6   No current facility-administered medications on file prior to visit.    BP 120/80 mmHg  Pulse 76  Temp(Src) 98.6 F (37 C) (Oral)  Wt 205 lb 6.4 oz (93.169 kg)  SpO2 98%       Objective:   Physical Exam  General  Mental Status - Alert. General Appearance - Well groomed. Not in acute distress.  Skin Rashes- No Rashes.  HEENT Head- Normal. Ear Auditory Canal - Left- Normal. Right - Normal.Tympanic Membrane- Left- Normal. Right- red. Eye Sclera/Conjunctiva- Left- Normal. Right- Normal. Nose & Sinuses Nasal Mucosa- Left-  Mld  boggy + Congested. Right-  Mild  boggy + Congested. Mouth & Throat Lips: Upper Lip- Normal: no dryness, cracking, pallor, cyanosis, or vesicular eruption. Lower Lip-Normal: no dryness, cracking, pallor, cyanosis or vesicular eruption. Buccal Mucosa- Bilateral- No Aphthous ulcers. Oropharynx- No Discharge or Erythema. +pnd. Tonsils: Characteristics- Bilateral- mild  Erythema + Congestion. Size/Enlargement- Bilateral- 1-2 +enlargement. Discharge- bilateral-None.  Neck Neck- Supple. No Masses. Mild enlarged  submandibular nodes, but not tender.   Chest and Lung Exam Auscultation: Breath Sounds:- even and unlabored.  Cardiovascular Auscultation:Rythm- Regular, rate and rhythm. Murmurs & Other Heart Sounds:Ausculatation of the heart reveal- No Murmurs.  Lymphatic Head & Neck General Head & Neck Lymphatics: Bilateral: Description-  Mild submandibular nodes enlarged but not tender.        Assessment & Plan:

## 2015-05-09 NOTE — Assessment & Plan Note (Signed)
Recent symptom 2 wks ago sound allergic. Rx nasonex and claritin.

## 2015-05-09 NOTE — Patient Instructions (Signed)
Allergic rhinitis Recent symptom 2 wks ago sound allergic. Rx nasonex and claritin.     Otitis media Rt side following recent nasal congestion. Lt ear pressure likely eustachian tube dysfunction. Rx azithromycin for OM. Nasonex for ear pressure.  Stop cipro antibiotic next 7 days while on cipro.     Also pharyngitis by exam. Azithromycin would treat for strep in event that is present as well.  Follow up in 7 days or as needed

## 2015-05-09 NOTE — Progress Notes (Signed)
Pre visit review using our clinic review tool, if applicable. No additional management support is needed unless otherwise documented below in the visit note. 

## 2015-05-18 ENCOUNTER — Ambulatory Visit (INDEPENDENT_AMBULATORY_CARE_PROVIDER_SITE_OTHER): Payer: BC Managed Care – PPO | Admitting: Neurology

## 2015-05-18 ENCOUNTER — Encounter: Payer: Self-pay | Admitting: Neurology

## 2015-05-18 VITALS — BP 120/70 | HR 92 | Ht 73.0 in | Wt 207.6 lb

## 2015-05-18 DIAGNOSIS — G250 Essential tremor: Secondary | ICD-10-CM | POA: Insufficient documentation

## 2015-05-18 DIAGNOSIS — G2581 Restless legs syndrome: Secondary | ICD-10-CM

## 2015-05-18 DIAGNOSIS — G43109 Migraine with aura, not intractable, without status migrainosus: Secondary | ICD-10-CM | POA: Diagnosis not present

## 2015-05-18 NOTE — Patient Instructions (Addendum)
1.  We will taper off the topiramate.  Take 2 pills (50mg ) at bedtime for 1 week, then 1 pill (25mg ) at bedtime for 1 week, then STOP. 2.  If you are considering trying to get off of the zoloft, then I wouldn't now.  I would wait to see how you respond after stopping the topiramate. 3.  Follow up in 6 months.

## 2015-05-18 NOTE — Progress Notes (Signed)
NEUROLOGY FOLLOW UP OFFICE NOTE  Tyler Chase [id number]  HISTORY OF PRESENT ILLNESS: Tyler Chase is a 29 year old right handed man with history of depression, migraine, tremor and eye disorder who follows up for migraine with aura, restless leg syndrome and essential tremor.  UPDATE: He has not had migraines for years. Current preventative therapy:  topiramate 75mg  Other medications:  Ambien CR 12.5mg  (rarely uses), sertraline 100mg   Tremors are mild and not bothersome. Restless leg is improved.  HEADACHE: Onset:  29 years old following a roller coaster ride. Location:  Bilateral retro-orbital and frontal, travels to the back of the head Quality:  pounding Intensity:  8-9/10 Aura:  Visual scotoma which spreads Prodrome:  Mild nausea Associated symptoms:  Nausea, vomiting, photophobia.  No phonophobia, osmophobia or autonomic symptoms. Duration:  3-4 hours Frequency:  Last migraine was two years ago Triggers/exacerbating factors:  Nitrates, sulfides, stress, lack of sleep Relieving factors:  sleep Activity:  Cannot function.  Needs to lay down and sleep  Past abortive therapy:  Ibuprofen, probable Imitrex shot (effective), sumatriptan (effective) Past preventative therapy:  uncertain  Caffeine:  1 cup coffee daily Alcohol:  1x/week Smoker:  no Diet:  good Exercise:  Not regularly Depression/stress:  controlled Sleep hygiene:  poor Family history of headache:  Mom, sisters  RESTLESS LEG He has a history of peroneal tendonitis of the right leg.  He had been seen and treated by PM&R.  For several months, he reports unintentional shaking and movement of his legs.  It occurs in bed only when his thoughts are racing.  There is some discomfort but it is not the typical presentation of discomfort with need to move the legs for relief of symptoms.  His boyfriend also noted that on a couple of occasions, his legs would kick while he was asleep.  Ferritin, B12 and folate  have been normal.  TREMOR For several years, he has had mild tremor of the hands, most noticeable when he is using his hand, such as writing.  He is a Geophysicist/field seismologist, and sometimes notices it when using his camera.  It is not at rest.  There is no family history of tremor.    PAST MEDICAL HISTORY: Past Medical History  Diagnosis Date  . Depression   . Generalized headaches   . GERD (gastroesophageal reflux disease)   . Anemia   . Anxiety   . Tendonitis     MEDICATIONS: Current Outpatient Prescriptions on File Prior to Visit  Medication Sig Dispense Refill  . mometasone (NASONEX) 50 MCG/ACT nasal spray 2 sprays each nares q day 17 g 0  . sertraline (ZOLOFT) 100 MG tablet Take 1 tablet (100 mg total) by mouth daily. 30 tablet 3  . topiramate (TOPAMAX) 25 MG capsule Take 3 capsules (75 mg total) by mouth at bedtime. 90 capsule 6   No current facility-administered medications on file prior to visit.    ALLERGIES: No Known Allergies  FAMILY HISTORY: Family History  Problem Relation Age of Onset  . Arthritis Mother   . Lymphoma Father   . Mental illness Maternal Uncle     great uncle  . Heart disease Maternal Grandfather   . Hypertension Maternal Grandfather   . Migraines Mother     SOCIAL HISTORY: History   Social History  . Marital Status: Single    Spouse Name: N/A  . Number of Children: 0  . Years of Education: N/A   Occupational History  . teacher    Social  History Main Topics  . Smoking status: Never Smoker   . Smokeless tobacco: Never Used  . Alcohol Use: Yes     Comment: social  . Drug Use: No  . Sexual Activity:    Partners: Female   Other Topics Concern  . Not on file   Social History Narrative    REVIEW OF SYSTEMS: Constitutional: No fevers, chills, or sweats, no generalized fatigue, change in appetite Eyes: No visual changes, double vision, eye pain Ear, nose and throat: No hearing loss, ear pain, nasal congestion, sore  throat Cardiovascular: No chest pain, palpitations Respiratory:  No shortness of breath at rest or with exertion, wheezes GastrointestinaI: No nausea, vomiting, diarrhea, abdominal pain, fecal incontinence Genitourinary:  No dysuria, urinary retention or frequency Musculoskeletal:  No neck pain, back pain Integumentary: No rash, pruritus, skin lesions Neurological: as above Psychiatric: No depression, insomnia, anxiety Endocrine: No palpitations, fatigue, diaphoresis, mood swings, change in appetite, change in weight, increased thirst Hematologic/Lymphatic:  No anemia, purpura, petechiae. Allergic/Immunologic: no itchy/runny eyes, nasal congestion, recent allergic reactions, rashes  PHYSICAL EXAM: Filed Vitals:   05/18/15 0823  BP: 120/70  Pulse: 92   General: No acute distress Head:  Normocephalic/atraumatic Eyes:  Fundoscopic exam unremarkable without vessel changes, exudates, hemorrhages or papilledema. Neck: supple, no paraspinal tenderness, full range of motion Heart:  Regular rate and rhythm Lungs:  Clear to auscultation bilaterally Back: No paraspinal tenderness Neurological Exam: alert and oriented to person, place, and time. Attention span and concentration intact, recent and remote memory intact, fund of knowledge intact.  Speech fluent and not dysarthric, language intact.  CN II-XII intact. Fundoscopic exam unremarkable without vessel changes, exudates, hemorrhages or papilledema.  Bulk and tone normal, muscle strength 5/5 throughout.  Sensation to light touch, temperature and vibration intact.  Deep tendon reflexes 2+ throughout, toes downgoing.  Finger to nose and heel to shin testing intact.  Gait normal, Romberg negative.  IMPRESSION: Migraine with aura, controlled Essential tremor Restless leg  PLAN: 1.  Since he has not had a migraine in years, will taper off topiramate. 2.  Follow up in 6 months.  Call with questions or concerns.  15 minutes spent face to face  with patient, over 50% spent discussing management.  Metta Clines, DO  CC:  Annye Asa, MD

## 2015-09-27 ENCOUNTER — Encounter: Payer: Self-pay | Admitting: Family Medicine

## 2015-09-27 ENCOUNTER — Ambulatory Visit (INDEPENDENT_AMBULATORY_CARE_PROVIDER_SITE_OTHER): Payer: BC Managed Care – PPO | Admitting: Family Medicine

## 2015-09-27 VITALS — BP 116/78 | HR 103 | Temp 98.0°F | Resp 16 | Wt 207.2 lb

## 2015-09-27 DIAGNOSIS — F411 Generalized anxiety disorder: Secondary | ICD-10-CM | POA: Diagnosis not present

## 2015-09-27 MED ORDER — FLUOXETINE HCL 20 MG PO TABS: 20.0000 mg | ORAL_TABLET | Freq: Every day | ORAL | Status: DC

## 2015-09-27 NOTE — Patient Instructions (Signed)
Follow up by phone or MyChart in 2-4 weeks to let me know how the new meds are working Start the Prozac once daily I applaud the openness and honesty- good for you!! Call with any questions or concerns Happy Fall!!!

## 2015-09-27 NOTE — Assessment & Plan Note (Signed)
Deteriorated since stopping Zoloft this summer due to ED.  ED resolved after stopping SSRI and Topamax.  Due to worsening anxiety/depression, will start low dose prozac and monitor for improvement in mood w/o side effects.  Pt expressed understanding and is in agreement w/ plan.

## 2015-09-27 NOTE — Progress Notes (Signed)
Pre visit review using our clinic review tool, if applicable. No additional management support is needed unless otherwise documented below in the visit note. 

## 2015-09-27 NOTE — Progress Notes (Signed)
   Subjective:    Patient ID: Koron Godeaux, male    DOB: 01/15/1986, 29 y.o.   MRN: [id number]  HPI Anxiety- stopped Zoloft this summer due to side effect of ED.  This resolved after stopping medication.  Pt also tapered off Topamax at the same time.  Pt was doing well with his anxiety and depression but states mood has worsened after being off the medication for a few months.   Review of Systems For ROS see HPI     Objective:   Physical Exam  Constitutional: He is oriented to person, place, and time. He appears well-developed and well-nourished. No distress.  HENT:  Head: Normocephalic and atraumatic.  Eyes: EOM are normal. Pupils are equal, round, and reactive to light.  Neurological: He is alert and oriented to person, place, and time.  Skin: Skin is warm and dry.  Psychiatric: He has a normal mood and affect. His behavior is normal. Thought content normal.  Vitals reviewed.         Assessment & Plan:

## 2016-02-08 ENCOUNTER — Other Ambulatory Visit: Payer: Self-pay | Admitting: Family Medicine

## 2016-02-08 NOTE — Telephone Encounter (Signed)
Medication filled to pharmacy as requested.  Letter mailed to pt to schedule a Physical

## 2016-03-13 ENCOUNTER — Other Ambulatory Visit: Payer: Self-pay | Admitting: General Practice

## 2016-03-13 MED ORDER — FLUOXETINE HCL 20 MG PO TABS: ORAL_TABLET | ORAL | Status: DC

## 2016-03-14 ENCOUNTER — Other Ambulatory Visit: Payer: Self-pay

## 2016-03-14 MED ORDER — FLUOXETINE HCL 20 MG PO TABS: 20.0000 mg | ORAL_TABLET | Freq: Every day | ORAL | Status: DC

## 2016-04-22 ENCOUNTER — Telehealth: Payer: Self-pay | Admitting: *Deleted

## 2016-04-22 NOTE — Telephone Encounter (Signed)
Unable to reach patient at time of pre-visit call. Left message for patient to return call when available.  

## 2016-04-23 ENCOUNTER — Ambulatory Visit (INDEPENDENT_AMBULATORY_CARE_PROVIDER_SITE_OTHER): Payer: BC Managed Care – PPO | Admitting: Family Medicine

## 2016-04-23 VITALS — BP 128/69 | HR 90 | Temp 98.8°F | Ht 73.0 in | Wt 219.0 lb

## 2016-04-23 DIAGNOSIS — E663 Overweight: Secondary | ICD-10-CM

## 2016-04-23 DIAGNOSIS — Z131 Encounter for screening for diabetes mellitus: Secondary | ICD-10-CM | POA: Diagnosis not present

## 2016-04-23 DIAGNOSIS — Z130 Encounter for screening for diseases of the blood and blood-forming organs and certain disorders involving the immune mechanism: Secondary | ICD-10-CM | POA: Diagnosis not present

## 2016-04-23 DIAGNOSIS — Z0000 Encounter for general adult medical examination without abnormal findings: Secondary | ICD-10-CM | POA: Diagnosis not present

## 2016-04-23 DIAGNOSIS — Z113 Encounter for screening for infections with a predominantly sexual mode of transmission: Secondary | ICD-10-CM

## 2016-04-23 DIAGNOSIS — Z13220 Encounter for screening for lipoid disorders: Secondary | ICD-10-CM

## 2016-04-23 DIAGNOSIS — B360 Pityriasis versicolor: Secondary | ICD-10-CM

## 2016-04-23 LAB — LIPID PANEL
CHOLESTEROL: 210 mg/dL — AB (ref 0–200)
HDL: 40.1 mg/dL (ref >39.00)
LDL Cholesterol: 136 mg/dL — ABNORMAL HIGH (ref 0–99)
NonHDL: 169.95
Total CHOL/HDL Ratio: 5
Triglycerides: 169 mg/dL — ABNORMAL HIGH (ref 0.0–149.0)
VLDL: 33.8 mg/dL (ref 0.0–40.0)

## 2016-04-23 LAB — COMPREHENSIVE METABOLIC PANEL
ALBUMIN: 4.5 g/dL (ref 3.5–5.2)
ALK PHOS: 46 U/L (ref 39–117)
ALT: 57 U/L — AB (ref 0–53)
AST: 32 U/L (ref 0–37)
BILIRUBIN TOTAL: 0.9 mg/dL (ref 0.2–1.2)
BUN: 26 mg/dL — AB (ref 6–23)
CO2: 24 mEq/L (ref 19–32)
Calcium: 9.3 mg/dL (ref 8.4–10.5)
Chloride: 101 mEq/L (ref 96–112)
Creatinine, Ser: 1.1 mg/dL (ref 0.40–1.50)
GFR: 82.92 mL/min (ref >60.00)
Glucose, Bld: 90 mg/dL (ref 70–99)
POTASSIUM: 3.9 meq/L (ref 3.5–5.1)
SODIUM: 135 meq/L (ref 135–145)
Total Protein: 7.2 g/dL (ref 6.0–8.3)

## 2016-04-23 LAB — CBC
HCT: 42.8 % (ref 39.0–52.0)
HEMOGLOBIN: 13.9 g/dL (ref 13.0–17.0)
MCHC: 32.5 g/dL (ref 30.0–36.0)
MCV: 69.1 fl — ABNORMAL LOW (ref 78.0–100.0)
Platelets: 310 10*3/uL (ref 150.0–400.0)
RBC: 6.2 Mil/uL — AB (ref 4.22–5.81)
RDW: 15.8 % — AB (ref 11.5–15.5)
WBC: 6.6 10*3/uL (ref 4.0–10.5)

## 2016-04-23 LAB — HEMOGLOBIN A1C: Hgb A1c MFr Bld: 5.4 % (ref 4.6–6.5)

## 2016-04-23 MED ORDER — FLUCONAZOLE 150 MG PO TABS: ORAL_TABLET | ORAL | Status: DC

## 2016-04-23 MED ORDER — ITRACONAZOLE 200 MG PO TABS: ORAL_TABLET | ORAL | Status: DC

## 2016-04-23 NOTE — Patient Instructions (Signed)
It was very nice to see you today Enjoy your summer break!  I will be in touch with labs asap Use the itraconazole daily for 5-7 days for your tinea versicolor. Topical Selsun blue shampoo can also help  You have put on a few pounds- work on getting these back off!   The longer you allow extra pounds to stay the harder they can be to get rid of   Wt Readings from Last 3 Encounters:  04/23/16 219 lb (99.338 kg)  09/27/15 207 lb 4 oz (94.008 kg)  05/18/15 207 lb 9.6 oz (94.167 kg)

## 2016-04-23 NOTE — Addendum Note (Signed)
Addended by: [place] Blinks C on: 04/23/2016 02:41 PM   Modules accepted: Orders

## 2016-04-23 NOTE — Progress Notes (Addendum)
[place] at [facility] [address] [place], [place] [address] [phone]  Date:  04/23/2016   Name:  Tyler Chase   DOB:  1986-02-08   MRN:  [id number]  PCP:  Annye Asa, MD    Chief Complaint: Annual Exam   History of Present Illness:  Tyler Chase is a 30 y.o. very pleasant male patient who presents with the following:  Here today for a CPE.  History of anxiety- he is on prozac for this.  Former pt of Dr Birdie Riddle.  Last screening labs about 3 years ago.  He feels like his anxiety is under reasonable control.   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    He has noted a rash on his back and chest- it comes back every year.  He thinks it is tinea versicolor- he has been treated for this in the past with itraconazole He also notes a "little blood spot in a fingernail."  It seems to be moving up as the nail grows. It is not painful and he does not recall any trauma Would like to have STI testing today for screenign   Patient Active Problem List   Diagnosis Date Noted  . Benign essential tremor 05/18/2015  . Allergic rhinitis 05/09/2015  . Otitis media 05/09/2015  . Possible exposure to STD 01/19/2015  . Migraine with aura and without status migrainosus, not intractable 08/17/2014  . Peroneal tendonitis of right lower extremity 04/10/2014  . Right leg pain 04/07/2014  . Tinea versicolor 03/10/2014  . Influenza 12/22/2013  . Internal hemorrhoids with other complication 123XX123  . Anal fissure 11/02/2013  . Dyspepsia and other specified disorders of function of stomach 10/28/2013  . Insomnia 10/14/2013  . Generalized anxiety disorder 10/14/2013  . GERD (gastroesophageal reflux disease) 10/14/2013  . BRBPR (bright  red blood per rectum) 10/14/2013  . Elimination disorder with fecal symptoms 10/14/2013  . Routine general medical examination at a health care facility 10/14/2013  . Streptococcal sore throat 08/16/2013    Past Medical History  Diagnosis Date  . Depression   . Generalized headaches   . GERD (gastroesophageal reflux disease)   . Anemia   . Anxiety   . Tendonitis     No past surgical history on file.  Social History  Substance Use Topics  . Smoking status: Never Smoker   . Smokeless tobacco: Never Used  . Alcohol Use: Yes     Comment: social    Family History  Problem Relation Age of Onset  . Arthritis Mother   . Lymphoma Father   . Mental illness Maternal Uncle     great uncle  . Heart disease Maternal Grandfather   . Hypertension Maternal Grandfather   . Migraines Mother     No Known Allergies  Medication list has been reviewed and updated.  Current Outpatient Prescriptions on File Prior to Visit  Medication Sig Dispense Refill  . FLUoxetine (PROZAC) 20 MG tablet TAKE 1 TABLET (20 MG TOTAL) BY MOUTH DAILY. 30 tablet 2   No current facility-administered medications on file prior to visit.    Review of Systems:  As per HPI- otherwise negative.   Physical Examination: Filed Vitals:  04/23/16 1400 04/23/16 1404  BP: 143/78 128/69  Pulse: 90   Temp: 98.8 F (37.1 C)    Filed Vitals:   04/23/16 1400  Height: 6\' 1"  (1.854 m)  Weight: 219 lb (99.338 kg)   Body mass index is 28.9 kg/(m^2). Ideal Body Weight: Weight in (lb) to have BMI = 25: 189.1  GEN: WDWN, NAD, Non-toxic, A & O x 3, overweight, looks well HEENT: Atraumatic, Normocephalic. Neck supple. No masses, No LAD.  Bilateral TM wnl, oropharynx normal.  PEERL,EOMI.   Ears and Nose: No external deformity. CV: RRR, No M/G/R. No JVD. No thrill. No extra heart sounds. PULM: CTA B, no wheezes, crackles, rhonchi. No retractions. No resp. distress. No accessory muscle use. ABD: S, NT, ND Tinea  versicolor on his back and shoulders Small traumatic hemorrhage in a thumb nail- reassured that this will grow out EXTR: No c/c/e NEURO Normal gait.  PSYCH: Normally interactive. Conversant. Not depressed or anxious appearing.  Calm demeanor.    Assessment and Plan: Physical exam  Screening for hyperlipidemia - Plan: Lipid panel  Screening for diabetes mellitus - Plan: Comprehensive metabolic panel, Hemoglobin A1c  Screening for STD (sexually transmitted disease) - Plan: RPR, Hepatitis B surface antibody, Hepatitis B surface antigen, Hepatitis C antibody, HIV antibody  Tinea versicolor - Plan: Itraconazole 200 MG TABS  Screening for deficiency anemia - Plan: CBC  Overweight  Labs as above Itraconazole for tinea versicolor He plan to work on losing the lbs he has gained Will plan further follow- up pending labs.    Signed [place] Blinks, MD  Pharm called- the itraconazole is very expensive.  Will change to diflucan  Meds ordered this encounter  Medications  . DISCONTD: Itraconazole 200 MG TABS    Sig: Take 1 daily for 7 days    Dispense:  14 tablet    Refill:  0  . fluconazole (DIFLUCAN) 150 MG tablet    Sig: Take 2 pills once, then repeat 2 pills a week later    Dispense:  4 tablet    Refill:  0

## 2016-04-23 NOTE — Progress Notes (Signed)
Pre visit review using our clinic tool,if applicable. No additional management support is needed unless otherwise documented below in the visit note.  

## 2016-04-24 ENCOUNTER — Other Ambulatory Visit (INDEPENDENT_AMBULATORY_CARE_PROVIDER_SITE_OTHER): Payer: BC Managed Care – PPO

## 2016-04-24 ENCOUNTER — Encounter: Payer: Self-pay | Admitting: Family Medicine

## 2016-04-24 DIAGNOSIS — Z789 Other specified health status: Secondary | ICD-10-CM | POA: Insufficient documentation

## 2016-04-24 DIAGNOSIS — R718 Other abnormality of red blood cells: Secondary | ICD-10-CM | POA: Diagnosis not present

## 2016-04-24 LAB — HIV ANTIBODY (ROUTINE TESTING W REFLEX): HIV 1&2 Ab, 4th Generation: NONREACTIVE

## 2016-04-24 LAB — FERRITIN: Ferritin: 80.9 ng/mL (ref 22.0–322.0)

## 2016-04-24 LAB — RPR

## 2016-04-24 LAB — HEPATITIS C ANTIBODY: HCV AB: NEGATIVE

## 2016-04-24 LAB — HEPATITIS B SURFACE ANTIGEN: Hepatitis B Surface Ag: NEGATIVE

## 2016-04-24 LAB — HEPATITIS B SURFACE ANTIBODY, QUANTITATIVE: Hepatitis B-Post: 11.2 m[IU]/mL

## 2016-12-04 ENCOUNTER — Encounter: Payer: Self-pay | Admitting: Family Medicine

## 2016-12-04 ENCOUNTER — Ambulatory Visit (INDEPENDENT_AMBULATORY_CARE_PROVIDER_SITE_OTHER): Payer: BC Managed Care – PPO | Admitting: Family Medicine

## 2016-12-04 VITALS — BP 133/77 | HR 100 | Temp 98.6°F | Ht 73.0 in | Wt 185.8 lb

## 2016-12-04 DIAGNOSIS — K7689 Other specified diseases of liver: Secondary | ICD-10-CM | POA: Diagnosis not present

## 2016-12-04 DIAGNOSIS — Z23 Encounter for immunization: Secondary | ICD-10-CM

## 2016-12-04 DIAGNOSIS — E785 Hyperlipidemia, unspecified: Secondary | ICD-10-CM

## 2016-12-04 DIAGNOSIS — R945 Abnormal results of liver function studies: Secondary | ICD-10-CM

## 2016-12-04 DIAGNOSIS — R5381 Other malaise: Secondary | ICD-10-CM | POA: Diagnosis not present

## 2016-12-04 LAB — COMPREHENSIVE METABOLIC PANEL
ALBUMIN: 4.4 g/dL (ref 3.5–5.2)
ALT: 16 U/L (ref 0–53)
AST: 14 U/L (ref 0–37)
Alkaline Phosphatase: 49 U/L (ref 39–117)
BUN: 14 mg/dL (ref 6–23)
CALCIUM: 9.2 mg/dL (ref 8.4–10.5)
CHLORIDE: 103 meq/L (ref 96–112)
CO2: 31 meq/L (ref 19–32)
CREATININE: 1.22 mg/dL (ref 0.40–1.50)
GFR: 73.29 mL/min (ref >60.00)
Glucose, Bld: 97 mg/dL (ref 70–99)
POTASSIUM: 3.9 meq/L (ref 3.5–5.1)
Sodium: 138 mEq/L (ref 135–145)
Total Bilirubin: 0.7 mg/dL (ref 0.2–1.2)
Total Protein: 7.1 g/dL (ref 6.0–8.3)

## 2016-12-04 LAB — LIPID PANEL
CHOL/HDL RATIO: 5
CHOLESTEROL: 242 mg/dL — AB (ref 0–200)
HDL: 45.7 mg/dL (ref >39.00)
LDL CALC: 172 mg/dL — AB (ref 0–99)
NonHDL: 196
TRIGLYCERIDES: 118 mg/dL (ref 0.0–149.0)
VLDL: 23.6 mg/dL (ref 0.0–40.0)

## 2016-12-04 NOTE — Patient Instructions (Signed)
It was great to see you today- have a wonderful rest of your break time!   We will check your cholesterol and liver function today- I'll be in touch with these results asap You got your tetanus shot today- this is good for about 10years It would be smart to start exercising; try to get some physical activity most days of the week.  Walking, the gym, tennis, jogging, weights- anything is good,  You can experiment until you find something that you enjoy  Take care!

## 2016-12-04 NOTE — Progress Notes (Signed)
Pre visit review using our clinic review tool, if applicable. No additional management support is needed unless otherwise documented below in the visit note. 

## 2016-12-04 NOTE — Progress Notes (Signed)
[place] at [facility] [address], [place], [place] [address] [phone]  Date:  12/04/2016   Name:  Tyler Chase   DOB:  March 06, 1986   MRN:  [id number]  PCP:  Annye Asa, MD    Chief Complaint: No chief complaint on file.   History of Present Illness:  Tyler Chase is a 30 y.o. very pleasant male patient who presents with the following:  Last seen in May of 2017 for a CPE. Partial HPI from that visit:  Here today for a CPE.  History of anxiety- he is on prozac for this.  Former pt of Dr Birdie Riddle.  Last screening labs about 3 years ago.  He feels like his anxiety is under reasonable control.   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    We also did labs in may- needs follow-up of his cholesterol, BUN today  Flu shot done for this year Tetanus: he thinks it has been 10 years or more.  Would like to update tdap today  He is now going to the [facility] in [place]- he is seeing psychiatry and a therapist.  He is on [place], buspar, and prozac.    He will fall asleep ok, but tends to wake up early if he wants to or not.   Usually wakes around 5am His energy level has been mostly good with the mydayis.  He tends to feel more anxious in the morning- will go away as the day goes on.  Admits he does tend to drink a lot of coffee in the am He is also on propranolol as needed- however he notes that it makes him tired so he does not use it much  He did eat a few bites today- he is working on a lower carb diet.   Admits he does not get a whole lot of exercise.    He would like to work on this- he does not have any particular sports or other physical activity interest at this time   Patient Active Problem List   Diagnosis  Date Noted  . Hepatitis B immune 04/24/2016  . Benign essential tremor 05/18/2015  . Allergic rhinitis 05/09/2015  . Migraine with aura and without status migrainosus, not intractable 08/17/2014  . Tinea versicolor 03/10/2014  . Internal hemorrhoids with other complication 123XX123  . Anal fissure 11/02/2013  . Insomnia 10/14/2013  . Generalized anxiety disorder 10/14/2013  . GERD (gastroesophageal reflux disease) 10/14/2013  . BRBPR (bright red blood per rectum) 10/14/2013  . Elimination disorder with fecal symptoms 10/14/2013    Past Medical History:  Diagnosis Date  . Anemia   . Anxiety   . Depression   . Generalized headaches   . GERD (gastroesophageal reflux disease)   . Tendonitis     No past surgical history on file.  Social History  Substance Use Topics  . Smoking status: Never Smoker  . Smokeless tobacco: Never Used  . Alcohol use Yes     Comment: social    Family History  Problem Relation Age of Onset  . Arthritis Mother   . Lymphoma Father   . Mental illness Maternal Uncle     great uncle  .  Heart disease Maternal Grandfather   . Hypertension Maternal Grandfather   . Migraines Mother     No Known Allergies  Medication list has been reviewed and updated.  Current Outpatient Prescriptions on File Prior to Visit  Medication Sig Dispense Refill  . fluconazole (DIFLUCAN) 150 MG tablet Take 2 pills once, then repeat 2 pills a week later 4 tablet 0  . FLUoxetine (PROZAC) 20 MG tablet TAKE 1 TABLET (20 MG TOTAL) BY MOUTH DAILY. 30 tablet 2   No current facility-administered medications on file prior to visit.     Review of Systems:  As per HPI- otherwise negative.  No fever, chills, CP, SOB, nausea, vomiting, rash  Wt Readings from Last 3 Encounters:  12/04/16 185 lb 12.8 oz (84.3 kg)  04/23/16 219 lb (99.3 kg)  09/27/15 207 lb 4 oz (94 kg)   He has lost some weight with his new diet    Physical Examination: Vitals:   12/04/16 0833  BP:  133/77  Pulse: 100  Temp: 98.6 F (37 C)       GEN: WDWN, NAD, Non-toxic, A & O x 3, looks well but out of shape HEENT: Atraumatic, Normocephalic. Neck supple. No masses, No LAD. Ears and Nose: No external deformity. CV: RRR, No M/G/R. No JVD. No thrill. No extra heart sounds. PULM: CTA B, no wheezes, crackles, rhonchi. No retractions. No resp. distress. No accessory muscle use. ABD: S, NT, ND EXTR: No c/c/e NEURO Normal gait.  PSYCH: Normally interactive. Conversant. Not depressed or anxious appearing.  Calm demeanor.    Assessment and Plan: Dyslipidemia - Plan: Lipid panel  Immunization due - Plan: Tdap vaccine greater than or equal to 7yo IM  Liver function abnormality - Plan: Comprehensive metabolic panel  Physical deconditioning  tdap due- will give today Flu shot is done BP is ok Will repeat his lipids today to monitor Encouraged him to start developing an exercise habit- he plans to work on this. See patient instructions for more details.      Signed [place] Blinks, MD

## 2016-12-05 ENCOUNTER — Encounter: Payer: Self-pay | Admitting: Family Medicine

## 2017-03-25 ENCOUNTER — Ambulatory Visit (INDEPENDENT_AMBULATORY_CARE_PROVIDER_SITE_OTHER): Payer: BC Managed Care – PPO | Admitting: Family Medicine

## 2017-03-25 ENCOUNTER — Ambulatory Visit: Payer: Self-pay | Admitting: Family Medicine

## 2017-03-25 VITALS — BP 122/82 | HR 78 | Temp 98.6°F | Ht 72.0 in | Wt 205.6 lb

## 2017-03-25 DIAGNOSIS — B360 Pityriasis versicolor: Secondary | ICD-10-CM

## 2017-03-25 MED ORDER — ITRACONAZOLE 100 MG PO CAPS: 200.0000 mg | ORAL_CAPSULE | Freq: Every day | ORAL | 1 refills | Status: DC

## 2017-03-25 NOTE — Patient Instructions (Signed)
Take the itraconazole daily for 5 days. I did give you a refill to use in case one course is not enough- you can repeat in 1 month if needed.

## 2017-03-25 NOTE — Progress Notes (Signed)
Pre visit review using our clinic review tool, if applicable. No additional management support is needed unless otherwise documented below in the visit note. 

## 2017-03-25 NOTE — Progress Notes (Signed)
[place] at [facility] [address] [place], [place] [address] [phone]  Date:  03/25/2017   Name:  Tyler Chase   DOB:  06-12-1986   MRN:  [id number]  PCP:  Annye Asa, MD    Chief Complaint: Tinea Versicolor (Hx of Tinea Versicolor. Pt has used cream in the past but would prefer pills. )   History of Present Illness:  Tyler Chase is a 31 y.o. 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   He has had better luck with oral treatment- terbinafine- in the past, and has always tolerated this well No itch- just has noted a rash for about one month On his trunk, very typical of the rash he has noted in the past   He has used itraconazole in the past and tolerated this well  He is on prozac 20 and Mydayis at this time, feels like his mood is good    Patient Active Problem List   Diagnosis Date Noted  . Hepatitis B immune 04/24/2016  . Benign essential tremor 05/18/2015  . Allergic rhinitis 05/09/2015  . Migraine with aura and without status migrainosus, not intractable 08/17/2014  . Tinea versicolor 03/10/2014  . Internal hemorrhoids with other complication 17/61/6073  . Anal fissure 11/02/2013  . Insomnia 10/14/2013  . Generalized anxiety disorder 10/14/2013  . GERD (gastroesophageal reflux disease) 10/14/2013  . BRBPR (bright red blood per rectum) 10/14/2013  . Elimination disorder with fecal symptoms 10/14/2013    Past Medical History:  Diagnosis Date  . Anemia   . Anxiety   . Depression   .  Generalized headaches   . GERD (gastroesophageal reflux disease)   . Tendonitis     No past surgical history on file.  Social History  Substance Use Topics  . Smoking status: Never Smoker  . Smokeless tobacco: Never Used  . Alcohol use Yes     Comment: social    Family History  Problem Relation Age of Onset  . Arthritis Mother   . Lymphoma Father   . Mental illness Maternal Uncle     great uncle  . Heart disease Maternal Grandfather   . Hypertension Maternal Grandfather   . Migraines Mother     No Known Allergies  Medication list has been reviewed and updated.  No current outpatient prescriptions on file prior to visit.   No current facility-administered medications on file prior to visit.     Review of Systems:  As per HPI- otherwise negative.   Physical Examination: Vitals:   03/25/17 1242  BP: 122/82  Pulse: 78  Temp: 98.6 F (37 C)   Vitals:   03/25/17 1242  Weight: 205 lb 9.6 oz (93.3 kg)  Height: 6' (1.829 m)   Body mass index is 27.88 kg/m. Ideal Body Weight: Weight in (lb) to have BMI = 25: 183.9  GEN: WDWN, NAD, Non-toxic, A & O x 3, overweight, otherwise looks well HEENT: Atraumatic, Normocephalic. Neck supple. No masses,  No LAD.  No mouth lesions  Ears and Nose: No external deformity. CV: RRR, No M/G/R. No JVD. No thrill. No extra heart sounds. PULM: CTA B, no wheezes, crackles, rhonchi. No retractions. No resp. distress. No accessory muscle use. ABD: S, NT, ND, +BS. No rebound. No HSM. EXTR: No c/c/e NEURO Normal gait.  PSYCH: Normally interactive. Conversant. Not depressed or anxious appearing.  Calm demeanor.  Tinea versicolor on his chest and back  Assessment and Plan: Tinea versicolor - Plan: itraconazole (SPORANOX) 100 MG capsule  He will alert me if not helpful   Signed [place] Blinks, MD

## 2017-06-11 ENCOUNTER — Encounter: Payer: Self-pay | Admitting: Family Medicine

## 2017-06-11 ENCOUNTER — Telehealth: Payer: Self-pay | Admitting: Medical

## 2017-06-11 NOTE — Telephone Encounter (Signed)
Dr. Lorelei Pont   Tinea versicolor is persistent on trunk of body.  I went through both bottles of the antifungal prescription given to me at the last visit. Is there another antifungal pill that you could prescribe or another treatment? Thank you!  This is message that was sent to me. I was thinking with the treatment failure you would refer to derm. I would be happy to put that in for you if you wan to go that route.  Thanks, Architect, Percell Miller, PA-C

## 2017-06-12 ENCOUNTER — Encounter: Payer: Self-pay | Admitting: Family Medicine

## 2017-06-13 MED ORDER — FLUCONAZOLE 150 MG PO TABS: ORAL_TABLET | ORAL | 0 refills | Status: DC

## 2017-08-27 ENCOUNTER — Emergency Department (HOSPITAL_COMMUNITY): Payer: BC Managed Care – PPO

## 2017-08-27 ENCOUNTER — Emergency Department (HOSPITAL_COMMUNITY)
Admission: EM | Admit: 2017-08-27 | Discharge: 2017-08-27 | Disposition: A | Discharge status: Home / Self Care | Payer: BC Managed Care – PPO | Attending: Emergency Medicine | Admitting: Emergency Medicine

## 2017-08-27 ENCOUNTER — Encounter (HOSPITAL_COMMUNITY): Payer: Self-pay | Admitting: Emergency Medicine

## 2017-08-27 DIAGNOSIS — R002 Palpitations: Secondary | ICD-10-CM | POA: Diagnosis present

## 2017-08-27 DIAGNOSIS — Z79899 Other long term (current) drug therapy: Secondary | ICD-10-CM | POA: Insufficient documentation

## 2017-08-27 DIAGNOSIS — I471 Supraventricular tachycardia: Secondary | ICD-10-CM | POA: Insufficient documentation

## 2017-08-27 LAB — COMPREHENSIVE METABOLIC PANEL
ALT: 40 U/L (ref 17–63)
ANION GAP: 7 (ref 5–15)
AST: 31 U/L (ref 15–41)
Albumin: 4.1 g/dL (ref 3.5–5.0)
Alkaline Phosphatase: 47 U/L (ref 38–126)
BUN: 10 mg/dL (ref 6–20)
CHLORIDE: 105 mmol/L (ref 101–111)
CO2: 24 mmol/L (ref 22–32)
Calcium: 9.5 mg/dL (ref 8.9–10.3)
Creatinine, Ser: 1.17 mg/dL (ref 0.61–1.24)
GFR calc non Af Amer: >60 mL/min (ref >60)
Glucose, Bld: 100 mg/dL — ABNORMAL HIGH (ref 65–99)
Potassium: 3.8 mmol/L (ref 3.5–5.1)
SODIUM: 136 mmol/L (ref 135–145)
Total Bilirubin: 1.7 mg/dL — ABNORMAL HIGH (ref 0.3–1.2)
Total Protein: 7 g/dL (ref 6.5–8.1)

## 2017-08-27 LAB — CBC WITH DIFFERENTIAL/PLATELET
BASOS PCT: 0 %
Basophils Absolute: 0 10*3/uL (ref 0.0–0.1)
EOS ABS: 0 10*3/uL (ref 0.0–0.7)
Eosinophils Relative: 0 %
HCT: 40.4 % (ref 39.0–52.0)
Hemoglobin: 13 g/dL (ref 13.0–17.0)
LYMPHS ABS: 1.9 10*3/uL (ref 0.7–4.0)
Lymphocytes Relative: 27 %
MCH: 21.6 pg — ABNORMAL LOW (ref 26.0–34.0)
MCHC: 32.2 g/dL (ref 30.0–36.0)
MCV: 67.1 fL — ABNORMAL LOW (ref 78.0–100.0)
MONO ABS: 0.3 10*3/uL (ref 0.1–1.0)
Monocytes Relative: 4 %
NEUTROS ABS: 5 10*3/uL (ref 1.7–7.7)
NEUTROS PCT: 69 %
PLATELETS: 314 10*3/uL (ref 150–400)
RBC: 6.02 MIL/uL — ABNORMAL HIGH (ref 4.22–5.81)
RDW: 15.2 % (ref 11.5–15.5)
WBC: 7.2 10*3/uL (ref 4.0–10.5)

## 2017-08-27 LAB — MAGNESIUM: MAGNESIUM: 2.2 mg/dL (ref 1.7–2.4)

## 2017-08-27 LAB — RAPID URINE DRUG SCREEN, HOSP PERFORMED
Amphetamines: NOT DETECTED
BENZODIAZEPINES: NOT DETECTED
Barbiturates: NOT DETECTED
COCAINE: NOT DETECTED
Opiates: NOT DETECTED
Tetrahydrocannabinol: NOT DETECTED

## 2017-08-27 LAB — I-STAT TROPONIN, ED: TROPONIN I, POC: 0.05 ng/mL (ref 0.00–0.08)

## 2017-08-27 LAB — ETHANOL

## 2017-08-27 NOTE — ED Triage Notes (Signed)
Pt here with c/o chest pressure was found to be in SVT , pt converted with 6mg  adenosine , hr 110 on arrival

## 2017-08-27 NOTE — ED Provider Notes (Signed)
[facility] DEPT Provider Note   CSN: [id number] Arrival date & time: 08/27/17  1043     History   Chief Complaint Chief Complaint  Patient presents with  . Tachycardia    HPI Tyler Chase is a 31 y.o. male.  HPI Patient with acute onset palpitations roughly 945 this morning. States he bent down and stood up prior to onset. Associated with mild shortness of breath especially with exertion. No previous history of arrhythmia. Take 2 drinks 30 ounces of coffee daily. Also had a shake with chocolate this morning. Denies any drugs or alcohol. Denies chest pain. Was given 6 of adenosine en route with conversion of SVT to normal sinus rhythm. Patient states he is feeling better right now. Past Medical History:  Diagnosis Date  . Anemia   . Anxiety   . Depression   . Generalized headaches   . GERD (gastroesophageal reflux disease)   . Tendonitis     Patient Active Problem List   Diagnosis Date Noted  . Hepatitis B immune 04/24/2016  . Benign essential tremor 05/18/2015  . Allergic rhinitis 05/09/2015  . Migraine with aura and without status migrainosus, not intractable 08/17/2014  . Tinea versicolor 03/10/2014  . Internal hemorrhoids with other complication 50/08/3817  . Anal fissure 11/02/2013  . Insomnia 10/14/2013  . Generalized anxiety disorder 10/14/2013  . GERD (gastroesophageal reflux disease) 10/14/2013  . BRBPR (bright red blood per rectum) 10/14/2013  . Elimination disorder with fecal symptoms 10/14/2013    History reviewed. No pertinent surgical history.     Home Medications    Prior to Admission medications   Medication Sig Start Date End Date Taking? Authorizing Provider  Amphet-Dextroamphet 3-Bead ER 37.5 MG CP24 Take 1 capsule by mouth daily.    Yes [provider]  coconut oil OIL Apply 1 application topically as needed (to feet).   Yes [provider]  FLUoxetine (PROZAC) 20 MG capsule Take 60 mg by mouth at bedtime.    Yes  [provider]  ibuprofen (ADVIL,MOTRIN) 200 MG tablet Take 400-600 mg by mouth every 6 (six) hours as needed for headache or mild pain.   Yes [provider]  magnesium oxide (MAG-OX) 400 MG tablet Take 400 mg by mouth at bedtime.   Yes [provider]  Multiple Vitamin (MULTIVITAMIN WITH MINERALS) TABS tablet Take 1 tablet by mouth daily.   Yes [provider]  fluconazole (DIFLUCAN) 150 MG tablet Take 2 pills (300mg ) by mouth once, and repeat dose in 1 week 06/13/17   Copland, Gay Filler, MD    Family History Family History  Problem Relation Age of Onset  . Arthritis Mother   . Migraines Mother   . Lymphoma Father   . Mental illness Maternal Uncle        great uncle  . Heart disease Maternal Grandfather   . Hypertension Maternal Grandfather     Social History Social History  Substance Use Topics  . Smoking status: Never Smoker  . Smokeless tobacco: Never Used  . Alcohol use Yes     Comment: social     Allergies   Food; Ilosone [erythromycin]; and Niacin and related   Review of Systems Review of Systems  Constitutional: Negative for chills and fever.  Eyes: Negative for visual disturbance.  Respiratory: Positive for shortness of breath.   Cardiovascular: Positive for palpitations. Negative for chest pain and leg swelling.  Gastrointestinal: Negative for abdominal pain, diarrhea, nausea and vomiting.  Musculoskeletal: Negative for back pain,  myalgias, neck pain and neck stiffness.  Skin: Negative for rash and wound.  Neurological: Positive for light-headedness. Negative for dizziness, weakness, numbness and headaches.  Psychiatric/Behavioral: The patient is nervous/anxious.   All other systems reviewed and are negative.    Physical Exam Updated Vital Signs BP 103/77   Pulse 91   Resp 18   SpO2 99%   Physical Exam  Constitutional: He is oriented to person, place, and time. He appears well-developed and well-nourished. No  distress.  HENT:  Head: Normocephalic and atraumatic.  Mouth/Throat: Oropharynx is clear and moist. No oropharyngeal exudate.  Eyes: Pupils are equal, round, and reactive to light. EOM are normal.  Neck: Normal range of motion. Neck supple.  Cardiovascular: Normal rate and regular rhythm.  Exam reveals no gallop and no friction rub.   No murmur heard. Pulmonary/Chest: Effort normal and breath sounds normal. No respiratory distress. He has no wheezes. He has no rales. He exhibits no tenderness.  Abdominal: Soft. Bowel sounds are normal. There is no tenderness. There is no rebound and no guarding.  Musculoskeletal: Normal range of motion. He exhibits no edema or tenderness.  No lower extremity swelling, asymmetry or tenderness. Distal pulses are 2+.  Neurological: He is alert and oriented to person, place, and time.  Moving all extremities without focal deficit. Sensation fully intact. No tremor appreciated.  Skin: Skin is warm and dry. Capillary refill takes less than 2 seconds. No rash noted. No erythema.  Psychiatric: He has a normal mood and affect. His behavior is normal.  Nursing note and vitals reviewed.    ED Treatments / Results  Labs (all labs ordered are listed, but only abnormal results are displayed) Labs Reviewed  CBC WITH DIFFERENTIAL/PLATELET - Abnormal; Notable for the following:       Result Value   RBC 6.02 (*)    MCV 67.1 (*)    MCH 21.6 (*)    All other components within normal limits  COMPREHENSIVE METABOLIC PANEL - Abnormal; Notable for the following:    Glucose, Bld 100 (*)    Total Bilirubin 1.7 (*)    All other components within normal limits  ETHANOL  RAPID URINE DRUG SCREEN, HOSP PERFORMED  MAGNESIUM  I-STAT TROPONIN, ED    EKG  EKG Interpretation None       Radiology Dg Chest 2 View  Result Date: 08/27/2017 CLINICAL DATA:  Shortness of breath. EXAM: CHEST  2 VIEW COMPARISON:  None. FINDINGS: The heart size and mediastinal contours are  within normal limits. Both lungs are clear. No pneumothorax or pleural effusion is noted. The visualized skeletal structures are unremarkable. IMPRESSION: No active cardiopulmonary disease. Electronically Signed   By: Marijo Conception, M.D.   On: 08/27/2017 11:37    Procedures Procedures (including critical care time)  Medications Ordered in ED Medications - No data to display   Initial Impression / Assessment and Plan / ED Course  I have reviewed the triage vital signs and the nursing notes.  Pertinent labs & imaging results that were available during my care of the patient were reviewed by me and considered in my medical decision making (see chart for details).    Patient remains in normal sinus rhythm. Will need to f/u with cardiology. Advised to decrease caffeine intake. Return precautions given.    Final Clinical Impressions(s) / ED Diagnoses   Final diagnoses:  SVT (supraventricular tachycardia) (HCC)    New Prescriptions New Prescriptions   No medications on file  Julianne Rice, MD 08/27/17 1515

## 2017-09-11 ENCOUNTER — Encounter: Payer: Self-pay | Admitting: Cardiovascular Disease

## 2017-09-11 ENCOUNTER — Ambulatory Visit (INDEPENDENT_AMBULATORY_CARE_PROVIDER_SITE_OTHER): Payer: BC Managed Care – PPO | Admitting: Cardiovascular Disease

## 2017-09-11 VITALS — BP 122/76 | HR 109 | Resp 16 | Ht 72.0 in | Wt 220.4 lb

## 2017-09-11 DIAGNOSIS — E782 Mixed hyperlipidemia: Secondary | ICD-10-CM | POA: Insufficient documentation

## 2017-09-11 DIAGNOSIS — D649 Anemia, unspecified: Secondary | ICD-10-CM | POA: Diagnosis not present

## 2017-09-11 DIAGNOSIS — I471 Supraventricular tachycardia: Secondary | ICD-10-CM | POA: Diagnosis not present

## 2017-09-11 DIAGNOSIS — D563 Thalassemia minor: Secondary | ICD-10-CM | POA: Insufficient documentation

## 2017-09-11 LAB — BASIC METABOLIC PANEL
BUN/Creatinine Ratio: 16 (ref 9–20)
BUN: 15 mg/dL (ref 6–20)
CO2: 24 mmol/L (ref 20–29)
Calcium: 9.5 mg/dL (ref 8.7–10.2)
Chloride: 98 mmol/L (ref 96–106)
Creatinine, Ser: 0.95 mg/dL (ref 0.76–1.27)
GFR, EST AFRICAN AMERICAN: 122 mL/min/{1.73_m2} (ref >59)
GFR, EST NON AFRICAN AMERICAN: 105 mL/min/{1.73_m2} (ref >59)
Glucose: 87 mg/dL (ref 65–99)
POTASSIUM: 4.3 mmol/L (ref 3.5–5.2)
SODIUM: 137 mmol/L (ref 134–144)

## 2017-09-11 LAB — HEPATIC FUNCTION PANEL
ALT: 38 IU/L (ref 0–44)
AST: 23 IU/L (ref 0–40)
Albumin: 4.6 g/dL (ref 3.5–5.5)
Alkaline Phosphatase: 54 IU/L (ref 39–117)
Bilirubin Total: 0.9 mg/dL (ref 0.0–1.2)
Bilirubin, Direct: 0.2 mg/dL (ref 0.00–0.40)
Total Protein: 7 g/dL (ref 6.0–8.5)

## 2017-09-11 LAB — LIPID PANEL
Chol/HDL Ratio: 4.5 ratio (ref 0.0–5.0)
Cholesterol, Total: 176 mg/dL (ref 100–199)
HDL: 39 mg/dL — ABNORMAL LOW (ref >39)
LDL CALC: 102 mg/dL — AB (ref 0–99)
TRIGLYCERIDES: 177 mg/dL — AB (ref 0–149)
VLDL CHOLESTEROL CAL: 35 mg/dL (ref 5–40)

## 2017-09-11 MED ORDER — PROPRANOLOL HCL 10 MG PO TABS: 10.0000 mg | ORAL_TABLET | Freq: Four times a day (QID) | ORAL | 11 refills | Status: DC | PRN

## 2017-09-11 NOTE — Progress Notes (Signed)
Cardiology Office Note:    Date:  09/11/2017   ID:  Tyler Chase, DOB 02/08/1986, MRN [id number]  PCP:  Tyler Mclean, MD  Cardiologist:  Tyler Moores, MD    Referring MD: Tyler Minium, MD   Problem list 1. Supraventricular tachycardia 2.  Depression / anxiety 3   Migraines 4.  AD/ HD    Chief Complaint  Patient presents with  . tachycardia    History of Present Illness:    Tyler Chase is a 31 y.o. male with a hx of PALPITATIONS.Marland Kitchen  2 weeks ago,  Had sudden onset of tachycardia , sudden pressure in chest, cold sweat.  Later had lightheaded Called EMS  Received IV adenosine which resolved the tachycardia  Drinks coffee,  Also takes amphet-dextroamphet ( [place])  Has taken this for a year   Rare ETOH.  Had a glass of champagne the night before this episode.  Does not exercise regular. 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 Anemia   . Anxiety   . Depression   . Generalized headaches   . GERD (gastroesophageal reflux disease)   . Tendonitis     No past surgical history on file.  Current Medications: Current Meds  Medication Sig  . Amphet-Dextroamphet 3-Bead ER 37.5 MG CP24 Take 1 capsule by mouth daily.   . coconut oil OIL Apply 1 application topically as needed (to feet).  Marland Kitchen FLUoxetine (PROZAC) 20 MG capsule Take 60 mg by mouth at bedtime.   Marland Kitchen ibuprofen (ADVIL,MOTRIN) 200 MG tablet Take 400-600 mg by mouth every 6 (six) hours as needed for headache or mild pain.  . [DISCONTINUED] magnesium oxide (MAG-OX) 400 MG tablet Take 400 mg by mouth at bedtime.     Allergies:   Food; Ilosone [erythromycin]; and Niacin and related   Social History   Social History  . Marital status: Single    Spouse name: N/A  . Number of children: 0  . Years of education: N/A   Occupational History  . teacher [employer]   Social History Main Topics  .  Smoking status: Never Smoker  . Smokeless tobacco: Never Used  . Alcohol use Yes     Comment: social  . Drug use: No  . Sexual activity: Yes    Partners: Female   Other Topics Concern  . None   Social History Narrative  . None     Family History: The patient's family history includes Arthritis in his mother; Heart disease in his maternal grandfather; Hypertension in his maternal grandfather; Lymphoma in his father; Mental illness in his maternal uncle; Migraines in his mother. ROS:   Please see the history of present illness.     All other systems reviewed and are negative.  EKGs/Labs/Other Studies Reviewed:    The following studies were reviewed today:   EKG:  EKG is  Not  ordered today.     Recent Labs: 08/27/2017: ALT 40; BUN 10; Creatinine, Ser 1.17; Hemoglobin 13.0; Magnesium 2.2; Platelets 314; Potassium 3.8; Sodium 136  Recent Lipid Panel    Component Value Date/Time   CHOL 242 (H) 12/04/2016 0904   TRIG 118.0 12/04/2016 0904   HDL 45.70 12/04/2016 0904   CHOLHDL 5 12/04/2016 0904   VLDL 23.6 12/04/2016 0904   LDLCALC 172 (H) 12/04/2016 0904    Physical Exam:    VS:  BP 122/76   Pulse Marland Kitchen)  109   Resp 16   Ht 6' (1.829 m)   Wt 220 lb 6.4 oz (100 kg)   SpO2 99%   BMI 29.89 kg/m     Wt Readings from Last 3 Encounters:  09/11/17 220 lb 6.4 oz (100 kg)  03/25/17 205 lb 9.6 oz (93.3 kg)  12/04/16 185 lb 12.8 oz (84.3 kg)     GEN:  Well nourished, well developed in no acute distress HEENT: Normal NECK: No JVD; No carotid bruits LYMPHATICS: No lymphadenopathy CARDIAC: RRR, no murmurs, rubs, gallops RESPIRATORY:  Clear to auscultation without rales, wheezing or rhonchi  ABDOMEN: Soft, non-tender, non-distended MUSCULOSKELETAL:  No edema; No deformity  SKIN: Warm and dry NEUROLOGIC:  Alert and oriented x 3 PSYCHIATRIC:  Normal affect   ASSESSMENT:    No diagnosis found. PLAN:    In order of problems listed above:  1. Supraventricular  tachycardia:  Tyler Chase  had an episode of SVT documented by EMS. He received adenosine 6 Mill grams IV and converted to normal sinus rhythm.  He's not had any further episodes. We discussed the 3 ways to terminate SVT including Valsalva maneuver, diving reflex, and carotid sinus massage. I've given him propranolol 10 mg tabs to take on an as-needed basis. Discussed hospice decreasing his ADHD medicines didn't make SVT worse.  We discussed starting long-term beta blocker but this may worsen his depression. I think he'll tolerate the as needed propranolol. I'll see him again in 3 months for follow-up visit  2. Hyperlipidemia: His lipids were drawn last year. His LDL is 172. Total chol  242.   HDL 45. He had been eating a very high fat diet. I've recommended that he eat a low-fat diet and exercise on a regular basis.  3. Anemia: He has a family history of anemia. His MCV is low. I suggested that he may have beta thalassemia. I've asked him to see his medical doctor for further evaluation.  Medication Adjustments/Labs and Tests Ordered: Current medicines are reviewed at length with the patient today.  Concerns regarding medicines are outlined above.  No orders of the defined types were placed in this encounter.  No orders of the defined types were placed in this encounter.   Signed, Tyler Moores, MD  09/11/2017 8:56 AM    [place]

## 2017-09-11 NOTE — Patient Instructions (Signed)
Medication Instructions:  START Propranolol 10 mg up to 4 times per day as needed for fast heart rate   Labwork: TODAY - basic metabolic panel, cholesterol, liver panel   Testing/Procedures: None Ordered   Follow-Up: Your physician recommends that you schedule a follow-up appointment in: 3 months with Dr. Acie Fredrickson   If you need a refill on your cardiac medications before your next appointment, please call your pharmacy.   Thank you for choosing [facility]! Christen Bame, RN [phone]

## 2017-12-03 ENCOUNTER — Ambulatory Visit: Payer: BC Managed Care – PPO | Admitting: Cardiovascular Disease

## 2017-12-08 NOTE — Progress Notes (Addendum)
[facility] [address] [place], [place] [address] [phone]  Date:  12/10/2017   Name:  Tyler Chase   DOB:  April 01, 1986   MRN:  [id number]  PCP:  Darreld Mclean, MD    Chief Complaint: No chief complaint on file.   History of Present Illness:  Tyler Chase is a 31 y.o. very pleasant male patient who presents with the following:  Here today for a CPE Flu and TdaP are UTD History of hyperlipidemia, tremor, GAD He saw Dr. Acie Fredrickson in September of this year:  1. Supraventricular tachycardia:  Tyler Chase  had an episode of SVT documented by EMS. He received adenosine 6 Mill grams IV and converted to normal sinus rhythm.  He's not had any further episodes. We discussed the 3 ways to terminate SVT including Valsalva maneuver, diving reflex, and carotid sinus massage. I've given him propranolol 10 mg tabs to take on an as-needed basis. Discussed hospice decreasing his ADHD medicines didn't make SVT worse. We discussed starting long-term beta blocker but this may worsen his depression. I think he'll tolerate the as needed propranolol. I'll see him again in 3 months for follow-up visit 2. Hyperlipidemia: His lipids were drawn last year. His LDL is 172. Total chol  242.   HDL 45. He had been eating a very high fat diet. I've recommended that he eat a low-fat diet and exercise on a regular basis. 3. Anemia: He has a family history of anemia. His MCV is low. I suggested that he may have beta thalassemia. I've asked him to see his medical doctor for further evaluation.  From my previous notes:  Here today for a CPE. History of anxiety- he is on prozac for this. Former pt of Dr Birdie Riddle. Last screening labs about 3 years ago.  He feels like his anxiety is under reasonable control. 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  We also did labs in may- needs follow-up of his cholesterol, BUN today He is now going to the [facility] in [place]- he is seeing psychiatry and a therapist.  He is on [place], buspar, and prozac.    He will fall asleep ok, but tends to wake up early if he wants to or not.   Usually wakes around 5am His energy level has been mostly good with the mydayis.  He tends to feel more anxious in the morning- will go away as the day goes on.  Admits he does tend to drink a lot of coffee in the am He is also on propranolol as needed- however he notes that it makes him tired so he does not use it much  He had labs in September of this year He did not have to use the propranolol and he has not noted any more incidents of tachycardia He has cut down on caffeine and continues to been seen at the mood treatment center. 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  He would be interested in having further diagnostic testing to make sure all is well.  His GF did have cardiac issues- he is not sure at what age this started  His dad has lymphoma x10 years- currently in remission Otherwise no family history of cancer that he is aware of   Patient Active Problem List   Diagnosis Date Noted  . Mixed hyperlipidemia 09/11/2017  . SVT (supraventricular tachycardia) ([facility]) 09/11/2017  . Anemia 09/11/2017  . Hepatitis B immune 04/24/2016  . Benign essential tremor 05/18/2015  . Allergic rhinitis 05/09/2015  . Migraine with aura and  without status migrainosus, not intractable 08/17/2014  . Tinea versicolor 03/10/2014  . Internal hemorrhoids with other complication 99/37/1696  . Anal fissure 11/02/2013  . Insomnia 10/14/2013  . Generalized anxiety disorder 10/14/2013  . GERD (gastroesophageal reflux disease) 10/14/2013  . BRBPR (bright red blood per rectum) 10/14/2013  . Elimination disorder with fecal symptoms 10/14/2013    Past Medical History:  Diagnosis Date  . Anemia   . Anxiety   . Depression   . Generalized headaches   . GERD (gastroesophageal reflux disease)   . Tendonitis     History reviewed. No pertinent surgical history.  Social History   Tobacco Use  . Smoking status: Never Smoker  . Smokeless tobacco: Never Used  Substance Use Topics  . Alcohol use: Yes    Comment: social  . Drug use: No    Family History  Problem Relation Age of Onset  . Arthritis Mother   . Migraines Mother   . Lymphoma Father   . Mental illness Maternal Uncle        great uncle  . Heart disease Maternal Grandfather   . Hypertension Maternal Grandfather     Allergies  Allergen Reactions  . Food     "nitrate meats"= gives migraines  . Ilosone [Erythromycin]     "childhood reaction"  . Niacin And Related Hives    Medication list has been reviewed and updated.  Current Outpatient Medications on File Prior to Visit  Medication Sig Dispense Refill  . Amphet-Dextroamphet 3-Bead ER 37.5 MG CP24 Take 1 capsule by mouth daily.     . coconut oil OIL Apply 1 application topically as needed (to feet).    Marland Kitchen FLUoxetine (PROZAC) 20 MG capsule Take 60 mg by mouth at bedtime.     Marland Kitchen ibuprofen (ADVIL,MOTRIN) 200 MG tablet Take 400-600 mg by mouth every 6 (six) hours as needed for headache or mild pain.    . Multiple Vitamin (MULTIVITAMIN WITH MINERALS) TABS tablet Take 1 tablet by mouth daily.    . propranolol (INDERAL) 10 MG tablet Take 1 tablet (10 mg total) by mouth 4 (four) times daily as needed. 60 tablet 11   No  current facility-administered medications on file prior to visit.     Review of Systems:  As per HPI- otherwise negative.   Physical Examination: Vitals:   12/10/17 1259  BP: 137/66  Pulse: 96  Resp: 16  Temp: 98.9 F (37.2 C)  SpO2: 99%   Vitals:   12/10/17 1259  Weight: 223 lb 3.2 oz (101.2 kg)  Height: 6\' 1"  (1.854 m)   Body mass index is 29.45 kg/m. Ideal Body Weight: Weight in (lb) to  have BMI = 25: 189.1  GEN: WDWN, NAD, Non-toxic, A & O x 3, overweight, otherwise looks well HEENT: Atraumatic, Normocephalic. Neck supple. No masses, No LAD.  Bilateral TM wnl, oropharynx normal.  PEERL,EOMI.   Ears and Nose: No external deformity. CV: RRR, No M/G/R. No JVD. No thrill. No extra heart sounds. PULM: CTA B, no wheezes, crackles, rhonchi. No retractions. No resp. distress. No accessory muscle use. ABD: S, NT, ND. No rebound. No HSM. EXTR: No c/c/e NEURO Normal gait.  PSYCH: Normally interactive. Conversant. Not depressed or anxious appearing.  Calm demeanor.    Assessment and Plan: Physical exam  Temperature intolerance - Plan: TSH  Microcytosis - Plan: Hemoglobinopathy Evaluation, Ferritin  SOB (shortness of breath) on exertion - Plan: Exercise Tolerance Test  Overweight  CPE today He is doing well overall, but would like to work on weight loss He does note some SOB with exertion which he feels is not like normal SOB one might have with exercise. He would like to have an ETT which I think is reasonable  Will arrange this for him, and he will let me know if he does not hear about his appt Will also obtain labs to eval his microcytosis and thyroid as he notes feeling hot when others are cold, and he has persistent microcytosis    Signed Lamar Blinks, MD  1/2- received his labs as below:   Results for orders placed or performed in visit on 12/10/17  TSH  Result Value Ref Range   TSH 0.81 0.35 - 4.50 uIU/mL  Hemoglobinopathy Evaluation  Result Value  Ref Range   RBC 5.89 (H) 4.20 - 5.80 Million/uL   Hemoglobin 13.5 13.2 - 17.1 g/dL   HCT 41.4 38.5 - 50.0 %   MCV 70.3 (L) 80.0 - 100.0 fL   MCH 22.9 (L) 27.0 - 33.0 pg   RDW 17.4 (H) 11.0 - 15.0 %   Hgb A 93.9 (L) >96.0 %   Fetal Hemoglobin Testing <1.0 0.0 - 1.9 %   Hemoglobin A2 - HGBRFX 5.1 (H) 1.8 - 3.5 %   Interpretation    Ferritin  Result Value Ref Range   Ferritin 87.6 22.0 - 322.0 ng/mL   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 Correlation with iron status is suggested  See mychart message to pt 12/16/17

## 2017-12-10 ENCOUNTER — Encounter: Payer: Self-pay | Admitting: Family Medicine

## 2017-12-10 ENCOUNTER — Ambulatory Visit (INDEPENDENT_AMBULATORY_CARE_PROVIDER_SITE_OTHER): Payer: BC Managed Care – PPO | Admitting: Family Medicine

## 2017-12-10 VITALS — BP 137/66 | HR 96 | Temp 98.9°F | Resp 16 | Ht 73.0 in | Wt 223.2 lb

## 2017-12-10 DIAGNOSIS — Z0000 Encounter for general adult medical examination without abnormal findings: Secondary | ICD-10-CM

## 2017-12-10 DIAGNOSIS — E663 Overweight: Secondary | ICD-10-CM

## 2017-12-10 DIAGNOSIS — R6889 Other general symptoms and signs: Secondary | ICD-10-CM

## 2017-12-10 DIAGNOSIS — R718 Other abnormality of red blood cells: Secondary | ICD-10-CM

## 2017-12-10 DIAGNOSIS — R0602 Shortness of breath: Secondary | ICD-10-CM

## 2017-12-10 NOTE — Patient Instructions (Signed)
It was good to see you today- best of luck with your upcoming busy semester!   I will be in touch with your labs from today We will arrange for a treadmill stress test to make sure your heart looks ok. Assuming this is ok, I would encourage you to gradually work on increasing your exercise regiemn and fitness level

## 2017-12-11 LAB — FERRITIN: Ferritin: 87.6 ng/mL (ref 22.0–322.0)

## 2017-12-11 LAB — TSH: TSH: 0.81 u[IU]/mL (ref 0.35–4.50)

## 2017-12-16 ENCOUNTER — Encounter: Payer: Self-pay | Admitting: Family Medicine

## 2017-12-16 LAB — HEMOGLOBINOPATHY EVALUATION
Fetal Hemoglobin Testing: <1 % (ref 0.0–1.9)
HCT: 41.4 % (ref 38.5–50.0)
HEMOGLOBIN A2 - HGBRFX: 5.1 % — AB (ref 1.8–3.5)
Hemoglobin: 13.5 g/dL (ref 13.2–17.1)
Hgb A: 93.9 % — ABNORMAL LOW (ref >96.0)
MCH: 22.9 pg — AB (ref 27.0–33.0)
MCV: 70.3 fL — ABNORMAL LOW (ref 80.0–100.0)
RBC: 5.89 10*6/uL — AB (ref 4.20–5.80)
RDW: 17.4 % — ABNORMAL HIGH (ref 11.0–15.0)

## 2017-12-22 ENCOUNTER — Ambulatory Visit (INDEPENDENT_AMBULATORY_CARE_PROVIDER_SITE_OTHER): Payer: BC Managed Care – PPO | Admitting: Cardiovascular Disease

## 2017-12-22 ENCOUNTER — Encounter: Payer: Self-pay | Admitting: Cardiovascular Disease

## 2017-12-22 VITALS — BP 122/82 | HR 93 | Ht 72.0 in | Wt 220.0 lb

## 2017-12-22 DIAGNOSIS — R5381 Other malaise: Secondary | ICD-10-CM | POA: Diagnosis not present

## 2017-12-22 DIAGNOSIS — I471 Supraventricular tachycardia: Secondary | ICD-10-CM | POA: Diagnosis not present

## 2017-12-22 NOTE — Patient Instructions (Signed)
Medication Instructions:  Your physician recommends that you continue on your current medications as directed. Please refer to the Current Medication list given to you today.   Labwork: None Ordered   Testing/Procedures: None Ordered   Follow-Up: Your physician wants you to follow-up in: 6 months with Dr. Nahser.  You will receive a reminder letter in the mail two months in advance. If you don't receive a letter, please call our office to schedule the follow-up appointment.   If you need a refill on your cardiac medications before your next appointment, please call your pharmacy.   Thank you for choosing [facility]! Arland Usery, RN [phone]    

## 2017-12-22 NOTE — Progress Notes (Signed)
Cardiology Office Note:    Date:  12/22/2017   ID:  Tyler Chase, DOB 1986-04-14, MRN [id number]  PCP:  Darreld Mclean, MD  Cardiologist:  Mertie Moores, MD    Referring MD: Darreld Mclean, MD   Problem list 1. Supraventricular tachycardia 2.  Depression / anxiety 3   Migraines 4.  AD/ HD    Chief Complaint  Patient presents with  . Shortness of Breath  . Hyperlipidemia    History of Present Illness:    Tyler Chase is a 32 y.o. male with a hx of PALPITATIONS.Marland Kitchen  2 weeks ago,  Had sudden onset of tachycardia , sudden pressure in chest, cold sweat.  Later had lightheaded Called EMS  Received IV adenosine which resolved the tachycardia  Drinks coffee,  Also takes amphet-dextroamphet ( [place])  Has taken this for a year   Rare ETOH.  Had a glass of champagne the night before this episode.  Does not exercise regular. 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 8, 2019:  Tyler Chase is seen back today for follow-up of his supraventricular tachycardia and also some episodes of shortness of breath He has noted some DOE . Just started exercise program.   Has not exercised regularly for years  No CP .   Occasional sharp pains.   Has started his exercise program and seems to be doing ok.   Has had some brief episodes of fast HR and does low deep breath and on one occasion , did a valsalva.  hes not sure if it was SVT or not   Working out 3 times a week.   Runs on the treadmill.  Typically walk / runs about a mile .    Past Medical History:  Diagnosis Date  . Anemia   . Anxiety   . Depression   . Generalized headaches   . GERD (gastroesophageal reflux disease)   . Tendonitis     History reviewed. No pertinent surgical history.  Current Medications: Current Meds  Medication Sig  . Amphet-Dextroamphet 3-Bead ER 37.5 MG CP24 Take 1 capsule by mouth daily.   . coconut oil OIL Apply 1 application topically as needed (to  feet).  Marland Kitchen FLUoxetine (PROZAC) 20 MG capsule Take 60 mg by mouth at bedtime.   Marland Kitchen ibuprofen (ADVIL,MOTRIN) 200 MG tablet Take 400-600 mg by mouth every 6 (six) hours as needed for headache or mild pain.  . Multiple Vitamin (MULTIVITAMIN WITH MINERALS) TABS tablet Take 1 tablet by mouth daily.  . propranolol (INDERAL) 10 MG tablet Take 1 tablet (10 mg total) by mouth 4 (four) times daily as needed.     Allergies:   Food; Ilosone [erythromycin]; and Niacin and related   Social History   Socioeconomic History  . Marital status: Single    Spouse name: None  . Number of children: 0  . Years of education: None  . Highest education level: None  Social Needs  . Financial resource strain: None  . Food insecurity - worry: None  . Food insecurity - inability: None  . Transportation needs - medical: None  . Transportation needs - non-medical: None  Occupational History  . Occupation: Product manager: [employer]  Tobacco Use  . Smoking status: Never Smoker  . Smokeless tobacco: Never Used  Substance and Sexual Activity  . Alcohol use: Yes    Comment: social  . Drug use: No  . Sexual  activity: Yes    Partners: Female  Other Topics Concern  . None  Social History Narrative  . None     Family History: The patient's family history includes Arthritis in his mother; Heart disease in his maternal grandfather; Hypertension in his maternal grandfather; Lymphoma in his father; Mental illness in his maternal uncle; Migraines in his mother. ROS:   Please see the history of present illness.     All other systems reviewed and are negative.  EKGs/Labs/Other Studies Reviewed:    The following studies were reviewed today:   EKG: December 22, 2016: Normal sinus rhythm.  Right axis deviation.  He has no ST or T wave changes.  Recent Labs: 08/27/2017: Magnesium 2.2; Platelets 314 09/11/2017: ALT 38; BUN 15; Creatinine, Ser 0.95; Potassium 4.3; Sodium 137 12/10/2017:  Hemoglobin 13.5; TSH 0.81  Recent Lipid Panel    Component Value Date/Time   CHOL 176 09/11/2017 0937   TRIG 177 (H) 09/11/2017 0937   HDL 39 (L) 09/11/2017 0937   CHOLHDL 4.5 09/11/2017 0937   CHOLHDL 5 12/04/2016 0904   VLDL 23.6 12/04/2016 0904   LDLCALC 102 (H) 09/11/2017 0937    Physical Exam: Blood pressure 122/82, pulse 93, height 6' (1.829 m), weight 220 lb (99.8 kg), SpO2 98 %.  GEN:  Well nourished, well developed in no acute distress HEENT: Normal NECK: No JVD; No carotid bruits LYMPHATICS: No lymphadenopathy CARDIAC: RR, no murmurs, rubs, gallops RESPIRATORY:  Clear to auscultation without rales, wheezing or rhonchi  ABDOMEN: Soft, non-tender, non-distended MUSCULOSKELETAL:  No edema; No deformity  SKIN: Warm and dry NEUROLOGIC:  Alert and oriented x 3      ASSESSMENT:    No diagnosis found. PLAN:    In order of problems listed above:  1. Supraventricular tachycardia:  Tyler Chase  had an episode of SVT documented by EMS. He received adenosine 6 Mill grams IV and converted to normal sinus rhythm.  He's not had any further episodes. We discussed the 3 ways to terminate SVT including Valsalva maneuver, diving reflex, and carotid sinus massage. I've given him propranolol 10 mg tabs to take on an as-needed basis. Discussed hospice decreasing his ADHD medicines didn't make SVT worse.  We discussed starting long-term beta blocker but this may worsen his depression. I think he'll tolerate the as needed propranolol. I'll see him again in 3 months for follow-up visit  2.  Shortness of breath with exertion.  He has had some mild shortness of breath.  He is just started a routine exercise program and I suspect that he is somewhat deconditioned.  Instead of getting a stress test, I think that he should continue with his exercise program.  We will see him again in 6 months for repeat evaluation.  If he is not able to progress in his exercise regimen, then we will certainly  need stress testing and echocardiogram.  3. Hyperlipidemia: He is just started an exercise program.  We will readdress this later this year.  4. Anemia: Has beta thalassemia trait.  His current hemoglobin is normal.  Medication Adjustments/Labs and Tests Ordered: Current medicines are reviewed at length with the patient today.  Concerns regarding medicines are outlined above.  No orders of the defined types were placed in this encounter.  No orders of the defined types were placed in this encounter.   Signed, Mertie Moores, MD  12/22/2017 5:02 PM    [facility]

## 2018-01-05 NOTE — Progress Notes (Signed)
[place] at [facility] [address] [place], [place] [address] [phone]  Date:  01/06/2018   Name:  Tyler Chase   DOB:  1986/05/12   MRN:  [id number]  PCP:  Tyler Mclean, MD    Chief Complaint: Dysuria (c/o dysuria, one episode of white discharge, possible exposure to std. )   History of Present Illness:  Tyler Chase is a 32 y.o. very pleasant male patient who presents with the following:  I last saw him in December for a couple of concerns.  He had noted chest sx on exertion, and followed up with cardiology Per Dr. Elmarie Chase last note:  1. Supraventricular tachycardia:  Tyler Chase  had an episode of SVT documented by EMS. He received adenosine 6 Mill grams IV and converted to normal sinus rhythm. He's not had any further episodes. We discussed the 3 ways to terminate SVT including Valsalva maneuver, diving reflex, and carotid sinus massage. I've given him propranolol 10 mg tabs to take on an as-needed basis. Discussed hospice decreasing his ADHD medicines didn't make SVT worse. We discussed starting long-term beta blocker but this may worsen his depression. I think he'll tolerate the as needed propranolol. I'll see him again in 3 months for follow-up visit 2.  Shortness of breath with exertion.  He has had some mild shortness of breath.  He is just started a routine exercise program and I suspect that he is somewhat deconditioned.  Instead of getting a stress test, I think that he should continue with his exercise program.  We will see him again in 6 months for repeat evaluation.  If he is not able to progress in his exercise regimen, then we will certainly need stress testing and echocardiogram. 3. Hyperlipidemia: He is just started an exercise program.  We will readdress this later this year. 4. Anemia: Has beta thalassemia trait.  His current hemoglobin is normal  Here today with concern of needing STI screening and he  is also interest in testosterone testing  His BF (now ex-BF) had other partners over the last 11 months - they broke up, and he is concerned that he might have an STI He did have a little penile DC a couple of days ago, it was white in color . This has not happened again  He is otherwise feeling well He has noted some ED and other symptoms which he thinks could be indicative of low T- he would like to be tested for same.  He will come back in a couple of days to have BW a this must be drawn in the am   He also notes recurrent tinea versicolor- he tends to get this about one a year.  Topical treatment does not seem to work for him, he would like to be treated orally if possible Patient Active Problem List   Diagnosis Date Noted  . Mixed hyperlipidemia 09/11/2017  . SVT (supraventricular tachycardia) ([place]) 09/11/2017  . Beta thalassemia minor 09/11/2017  . Hepatitis B immune 04/24/2016  . Benign essential tremor 05/18/2015  . Allergic rhinitis 05/09/2015  . Migraine with aura and without status migrainosus, not intractable 08/17/2014  . Tinea versicolor 03/10/2014  . Internal hemorrhoids with other complication 31/51/7616  . Anal fissure 11/02/2013  . Insomnia 10/14/2013  . Generalized anxiety disorder 10/14/2013  . GERD (gastroesophageal reflux disease) 10/14/2013  . BRBPR (bright red blood per rectum) 10/14/2013  . Elimination disorder with fecal symptoms 10/14/2013  Past Medical History:  Diagnosis Date  . Anemia   . Anxiety   . Depression   . Generalized headaches   . GERD (gastroesophageal reflux disease)   . Tendonitis     No past surgical history on file.  Social History   Tobacco Use  . Smoking status: Never Smoker  . Smokeless tobacco: Never Used  Substance Use Topics  . Alcohol use: Yes    Comment: social  . Drug use: No    Family History  Problem Relation Age of Onset  . Arthritis Mother   . Migraines Mother   . Lymphoma Father   . Mental illness  Maternal Uncle        great uncle  . Heart disease Maternal Grandfather   . Hypertension Maternal Grandfather     Allergies  Allergen Reactions  . Food     "nitrate meats"= gives migraines  . Ilosone [Erythromycin]     "childhood reaction"  . Niacin And Related Hives    Medication list has been reviewed and updated.  Current Outpatient Medications on File Prior to Visit  Medication Sig Dispense Refill  . Amphet-Dextroamphet 3-Bead ER 37.5 MG CP24 Take 1 capsule by mouth daily.     . coconut oil OIL Apply 1 application topically as needed (to feet).    Marland Kitchen FLUoxetine (PROZAC) 20 MG capsule Take 60 mg by mouth at bedtime.     Marland Kitchen ibuprofen (ADVIL,MOTRIN) 200 MG tablet Take 400-600 mg by mouth every 6 (six) hours as needed for headache or mild pain.    . Multiple Vitamin (MULTIVITAMIN WITH MINERALS) TABS tablet Take 1 tablet by mouth daily.    . propranolol (INDERAL) 10 MG tablet Take 1 tablet (10 mg total) by mouth 4 (four) times daily as needed. 60 tablet 11   No current facility-administered medications on file prior to visit.     Review of Systems:  As per HPI- otherwise negative.   Physical Examination: Vitals:   01/06/18 1326  BP: 116/81  Pulse: 94  Temp: 98.3 F (36.8 C)  SpO2: 99%   Vitals:   01/06/18 1326  Weight: 213 lb 4.8 oz (96.8 kg)  Height: 6' (1.829 m)   Body mass index is 28.93 kg/m. Ideal Body Weight: Weight in (lb) to have BMI = 25: 183.9  GEN: WDWN, NAD, Non-toxic, A & O x 3, overweight, looks well  HEENT: Atraumatic, Normocephalic. Neck supple. No masses, No LAD. Ears and Nose: No external deformity. CV: RRR, No M/G/R. No JVD. No thrill. No extra heart sounds. PULM: CTA B, no wheezes, crackles, rhonchi. No retractions. No resp. distress. No accessory muscle use. EXTR: No c/c/e NEURO Normal gait.  PSYCH: Normally interactive. Conversant. Not depressed or anxious appearing.  Calm demeanor.  Mild tinea versicolor on his trunk/ back   Assessment  and Plan: Routine screening for STI (sexually transmitted infection) - Plan: HIV antibody, Hepatitis C antibody, RPR, C. trachomatis/N. gonorrhoeae RNA  Erectile dysfunction, unspecified erectile dysfunction type - Plan: Testosterone Total,Free,Bio, Males  Tinea versicolor - Plan: Itraconazole 200 MG TABS  STI testing Testosterone level ordered Itraconazole for 5 days Will plan further follow- up pending labs.   Signed Lamar Blinks, MD

## 2018-01-06 ENCOUNTER — Encounter: Payer: Self-pay | Admitting: Family Medicine

## 2018-01-06 ENCOUNTER — Ambulatory Visit: Payer: BC Managed Care – PPO | Admitting: Family Medicine

## 2018-01-06 VITALS — BP 116/81 | HR 94 | Temp 98.3°F | Ht 72.0 in | Wt 213.3 lb

## 2018-01-06 DIAGNOSIS — N529 Male erectile dysfunction, unspecified: Secondary | ICD-10-CM

## 2018-01-06 DIAGNOSIS — B360 Pityriasis versicolor: Secondary | ICD-10-CM | POA: Diagnosis not present

## 2018-01-06 DIAGNOSIS — Z113 Encounter for screening for infections with a predominantly sexual mode of transmission: Secondary | ICD-10-CM | POA: Diagnosis not present

## 2018-01-06 MED ORDER — ITRACONAZOLE 200 MG PO TABS: ORAL_TABLET | ORAL | 0 refills | Status: DC

## 2018-01-06 NOTE — Patient Instructions (Signed)
Please leave a urine sample in the bathroom, then you are all set to go home Please come in for labs at your convenience in the next few days- the testosterone level needs to be drawn by about 8:30 am I also refilled your medication for the tinea versicolor- take daily for 5 days.   You can repeat treatment in 1-2 months if needed

## 2018-01-07 LAB — C. TRACHOMATIS/N. GONORRHOEAE RNA
C. trachomatis RNA, TMA: NOT DETECTED
N. GONORRHOEAE RNA, TMA: NOT DETECTED

## 2018-01-08 ENCOUNTER — Other Ambulatory Visit (INDEPENDENT_AMBULATORY_CARE_PROVIDER_SITE_OTHER): Payer: BC Managed Care – PPO

## 2018-01-08 DIAGNOSIS — N529 Male erectile dysfunction, unspecified: Secondary | ICD-10-CM

## 2018-01-08 DIAGNOSIS — Z113 Encounter for screening for infections with a predominantly sexual mode of transmission: Secondary | ICD-10-CM

## 2018-01-11 ENCOUNTER — Encounter: Payer: Self-pay | Admitting: Family Medicine

## 2018-01-11 LAB — TESTOSTERONE TOTAL,FREE,BIO, MALES
ALBUMIN MSPROF: 4.6 g/dL (ref 3.6–5.1)
SEX HORMONE BINDING: 46 nmol/L (ref 10–50)
TESTOSTERONE BIOAVAILABLE: 119.8 ng/dL (ref >=110.0)
TESTOSTERONE FREE: 57.1 pg/mL (ref 46.0–224.0)
TESTOSTERONE: 558 ng/dL (ref 250–827)

## 2018-01-11 LAB — HIV ANTIBODY (ROUTINE TESTING W REFLEX): HIV: NONREACTIVE

## 2018-01-11 LAB — HEPATITIS C ANTIBODY
Hepatitis C Ab: NONREACTIVE
SIGNAL TO CUT-OFF: 0.02 (ref <1.00)

## 2018-01-11 LAB — RPR: RPR: NONREACTIVE

## 2018-02-23 ENCOUNTER — Encounter: Payer: Self-pay | Admitting: Family Medicine

## 2018-02-28 NOTE — Progress Notes (Signed)
[place] at [facility] [address], [place], [place] [address] [phone]  Date:  03/01/2018   Name:  Tyler Chase   DOB:  06/14/1986   MRN:  [id number]  PCP:  Darreld Mclean, MD    Chief Complaint: Medication discussion (Pt here to discuss starting PrEP for HIV prevention. )   History of Present Illness:  Tyler Chase is a 32 y.o. very pleasant male patient who presents with the following:  He is interested in starting PrEP for HIV prevention  Will need negative HIV test HIV RNA if any high risk exposure in the last 4 weeks Make sure GFR is over 60 Screen for Hep C Pt is hep B immune Get UA Will treat with Truvada.  Will need HIV testing every 3 months  He is thinking of starting a new relationship- he does not have any current partner or any partner who is known to be HIV positive   Patient Active Problem List   Diagnosis Date Noted  . Mixed hyperlipidemia 09/11/2017  . SVT (supraventricular tachycardia) ([place]) 09/11/2017  . Beta thalassemia minor 09/11/2017  . Hepatitis B immune 04/24/2016  . Benign essential tremor 05/18/2015  . Allergic rhinitis 05/09/2015  . Migraine with aura and without status migrainosus, not intractable 08/17/2014  . Tinea versicolor 03/10/2014  . Internal hemorrhoids with other complication 23/76/2831  . Anal fissure 11/02/2013  . Insomnia 10/14/2013  . Generalized anxiety disorder 10/14/2013  . GERD (gastroesophageal reflux disease) 10/14/2013  . BRBPR (bright red blood per rectum) 10/14/2013  . Elimination disorder with fecal symptoms 10/14/2013    Past Medical History:  Diagnosis Date  . Anemia   . Anxiety   . Depression   . Generalized headaches   . GERD (gastroesophageal reflux disease)   . Tendonitis     No past surgical history on file.  Social History   Tobacco Use  . Smoking status: Never Smoker  . Smokeless tobacco: Never Used  Substance Use Topics  .  Alcohol use: Yes    Comment: social  . Drug use: No    Family History  Problem Relation Age of Onset  . Arthritis Mother   . Migraines Mother   . Lymphoma Father   . Mental illness Maternal Uncle        great uncle  . Heart disease Maternal Grandfather   . Hypertension Maternal Grandfather     Allergies  Allergen Reactions  . Food     "nitrate meats"= gives migraines  . Ilosone [Erythromycin]     "childhood reaction"  . Niacin And Related Hives    Medication list has been reviewed and updated.  Current Outpatient Medications on File Prior to Visit  Medication Sig Dispense Refill  . Amphet-Dextroamphet 3-Bead ER 37.5 MG CP24 Take 1 capsule by mouth daily.     . coconut oil OIL Apply 1 application topically as needed (to feet).    Marland Kitchen FLUoxetine (PROZAC) 20 MG capsule Take 60 mg by mouth at bedtime.     Marland Kitchen ibuprofen (ADVIL,MOTRIN) 200 MG tablet Take 400-600 mg by mouth every 6 (six) hours as needed for headache or mild pain.    . Itraconazole 200 MG TABS Take daily for 5 days for tinea versicolor 10 tablet 0  . Multiple Vitamin (MULTIVITAMIN WITH MINERALS) TABS tablet Take 1 tablet by mouth daily.    . propranolol (INDERAL) 10 MG tablet Take 1 tablet (10 mg total) by  mouth 4 (four) times daily as needed. 60 tablet 11   No current facility-administered medications on file prior to visit.     Review of Systems:  As per HPI- otherwise negative.   Physical Examination: Vitals:   03/01/18 1337  BP: 124/82  Pulse: (!) 102  Temp: 98.7 F (37.1 C)  SpO2: 97%   Vitals:   03/01/18 1337  Weight: 207 lb 12.8 oz (94.3 kg)  Height: 6\' 1"  (1.854 m)   Body mass index is 27.42 kg/m. Ideal Body Weight: Weight in (lb) to have BMI = 25: 189.1  GEN: WDWN, NAD, Non-toxic, A & O x 3, normal weight, looks well  HEENT: Atraumatic, Normocephalic. Neck supple. No masses, No LAD. Ears and Nose: No external deformity. CV: RRR, No M/G/R. No JVD. No thrill. No extra heart sounds. PULM:  CTA B, no wheezes, crackles, rhonchi. No retractions. No resp. distress. No accessory muscle use EXTR: No c/c/e NEURO Normal gait.  PSYCH: Normally interactive. Conversant. Not depressed or anxious appearing.  Calm demeanor.    Assessment and Plan: Medication monitoring encounter - Plan: Basic metabolic panel  Exposure to HIV - Plan: Hepatitis C antibody, HIV antibody, emtricitabine-tenofovir (TRUVADA) 200-300 MG tablet  Here today as he would like to start on PrEP Will obtain labs as above.  Assuming ok, will start him on truvada once daily Plan for labs every 3 months, he understands he will need HIV screening every 90 days and is ok with this plan   Signed Lamar Blinks, MD

## 2018-03-01 ENCOUNTER — Encounter: Payer: Self-pay | Admitting: Family Medicine

## 2018-03-01 ENCOUNTER — Ambulatory Visit: Payer: BC Managed Care – PPO | Admitting: Family Medicine

## 2018-03-01 VITALS — BP 124/82 | HR 102 | Temp 98.7°F | Ht 73.0 in | Wt 207.8 lb

## 2018-03-01 DIAGNOSIS — Z206 Contact with and (suspected) exposure to human immunodeficiency virus [HIV]: Secondary | ICD-10-CM

## 2018-03-01 DIAGNOSIS — Z5181 Encounter for therapeutic drug level monitoring: Secondary | ICD-10-CM | POA: Diagnosis not present

## 2018-03-01 LAB — BASIC METABOLIC PANEL
BUN: 13 mg/dL (ref 6–23)
CALCIUM: 9.6 mg/dL (ref 8.4–10.5)
CO2: 29 mEq/L (ref 19–32)
CREATININE: 1.33 mg/dL (ref 0.40–1.50)
Chloride: 100 mEq/L (ref 96–112)
GFR: 65.82 mL/min (ref >60.00)
GLUCOSE: 101 mg/dL — AB (ref 70–99)
Potassium: 3.5 mEq/L (ref 3.5–5.1)
Sodium: 137 mEq/L (ref 135–145)

## 2018-03-01 MED ORDER — EMTRICITABINE-TENOFOVIR DF 200-300 MG PO TABS: 1.0000 | ORAL_TABLET | Freq: Every day | ORAL | 0 refills | Status: DC

## 2018-03-01 NOTE — Patient Instructions (Signed)
Good to see you today- I will be in touch with your labs asap Assuming negative, you can go ahead and start on the truvada once daily.  We will need to repeat your labs in 3 months

## 2018-03-02 ENCOUNTER — Encounter: Payer: Self-pay | Admitting: Family Medicine

## 2018-03-02 LAB — HEPATITIS C ANTIBODY
Hepatitis C Ab: NONREACTIVE
SIGNAL TO CUT-OFF: 0.02 (ref <1.00)

## 2018-03-02 LAB — HIV ANTIBODY (ROUTINE TESTING W REFLEX): HIV: NONREACTIVE

## 2018-04-16 ENCOUNTER — Encounter: Payer: Self-pay | Admitting: Family Medicine

## 2018-04-16 ENCOUNTER — Other Ambulatory Visit: Payer: Self-pay | Admitting: Family Medicine

## 2018-04-16 ENCOUNTER — Other Ambulatory Visit: Payer: Self-pay | Admitting: Emergency Medicine

## 2018-04-16 DIAGNOSIS — Z206 Contact with and (suspected) exposure to human immunodeficiency virus [HIV]: Secondary | ICD-10-CM

## 2018-04-16 MED ORDER — EMTRICITABINE-TENOFOVIR DF 200-300 MG PO TABS: 1.0000 | ORAL_TABLET | Freq: Every day | ORAL | 0 refills | Status: DC

## 2018-10-15 ENCOUNTER — Encounter: Payer: Self-pay | Admitting: Family Medicine

## 2018-10-15 DIAGNOSIS — Z206 Contact with and (suspected) exposure to human immunodeficiency virus [HIV]: Secondary | ICD-10-CM

## 2018-10-26 ENCOUNTER — Other Ambulatory Visit (INDEPENDENT_AMBULATORY_CARE_PROVIDER_SITE_OTHER): Payer: BC Managed Care – PPO

## 2018-10-26 DIAGNOSIS — Z206 Contact with and (suspected) exposure to human immunodeficiency virus [HIV]: Secondary | ICD-10-CM | POA: Diagnosis not present

## 2018-10-27 ENCOUNTER — Encounter: Payer: Self-pay | Admitting: Family Medicine

## 2018-10-27 ENCOUNTER — Other Ambulatory Visit: Payer: Self-pay | Admitting: Family Medicine

## 2018-10-27 DIAGNOSIS — Z206 Contact with and (suspected) exposure to human immunodeficiency virus [HIV]: Secondary | ICD-10-CM

## 2018-10-27 LAB — HIV ANTIBODY (ROUTINE TESTING W REFLEX): HIV 1&2 Ab, 4th Generation: NONREACTIVE

## 2018-10-27 LAB — BASIC METABOLIC PANEL
BUN: 19 mg/dL (ref 6–23)
CALCIUM: 9.7 mg/dL (ref 8.4–10.5)
CO2: 31 mEq/L (ref 19–32)
Chloride: 97 mEq/L (ref 96–112)
Creatinine, Ser: 1.28 mg/dL (ref 0.40–1.50)
GFR: 68.52 mL/min (ref >60.00)
GLUCOSE: 87 mg/dL (ref 70–99)
Potassium: 4 mEq/L (ref 3.5–5.1)
SODIUM: 137 meq/L (ref 135–145)

## 2018-10-27 MED ORDER — EMTRICITABINE-TENOFOVIR DF 200-300 MG PO TABS: 1.0000 | ORAL_TABLET | Freq: Every day | ORAL | 1 refills | Status: DC

## 2018-11-17 ENCOUNTER — Encounter: Payer: Self-pay | Admitting: Family Medicine

## 2018-11-17 DIAGNOSIS — Z206 Contact with and (suspected) exposure to human immunodeficiency virus [HIV]: Secondary | ICD-10-CM

## 2018-11-18 MED ORDER — EMTRICITABINE-TENOFOVIR DF 200-300 MG PO TABS: 1.0000 | ORAL_TABLET | Freq: Every day | ORAL | 1 refills | Status: DC

## 2018-12-24 NOTE — Progress Notes (Addendum)
[place] at [facility] [place], [place], [place] [address] [phone]  Date:  12/27/2018   Name:  Tyler Chase   DOB:  May 13, 1986   MRN:  [id number]  PCP:  Darreld Mclean, MD    Chief Complaint: Exposure to TB (possible exposure to tb, Signifcant other positive for tb blood test)   History of Present Illness:  Tyler Chase is a 33 y.o. very pleasant male patient who presents with the following:  Here today with concern of tuberculosis exposure Tyler Chase has history of hyperlipidemia, SVT, beta Thal minor, depression and anxiety He is also on HIV prophylaxis.  His BF noted onset of cough, hemoptysis x1- about 5 weeks ago He does have HIV but his viral load has been undetectable He had a positive quantiferon gold but we are not sure if he has had a chest x-ray yet  He has never had a positiveTB test  No cough, no unexplained weight loss  Tyler Chase is HIV negative  Patient Active Problem List   Diagnosis Date Noted  . Mixed hyperlipidemia 09/11/2017  . SVT (supraventricular tachycardia) ([place]) 09/11/2017  . Beta thalassemia minor 09/11/2017  . Hepatitis B immune 04/24/2016  . Benign essential tremor 05/18/2015  . Allergic rhinitis 05/09/2015  . Migraine with aura and without status migrainosus, not intractable 08/17/2014  . Tinea versicolor 03/10/2014  . Internal hemorrhoids with other complication 94/17/4081  . Anal fissure 11/02/2013  . Insomnia 10/14/2013  . Generalized anxiety disorder 10/14/2013  . GERD (gastroesophageal reflux disease) 10/14/2013  . BRBPR (bright red blood per rectum) 10/14/2013  . Elimination disorder with fecal symptoms 10/14/2013    Past Medical History:  Diagnosis Date  . Anemia   . Anxiety   . Depression   . Generalized headaches   . GERD (gastroesophageal reflux disease)   . Tendonitis     No past surgical history on file.  Social History   Tobacco Use  . Smoking status:  Never Smoker  . Smokeless tobacco: Never Used  Substance Use Topics  . Alcohol use: Yes    Comment: social  . Drug use: No    Family History  Problem Relation Age of Onset  . Arthritis Mother   . Migraines Mother   . Lymphoma Father   . Mental illness Maternal Uncle        great uncle  . Heart disease Maternal Grandfather   . Hypertension Maternal Grandfather     Allergies  Allergen Reactions  . Food     "nitrate meats"= gives migraines  . Ilosone [Erythromycin]     "childhood reaction"  . Niacin And Related Hives    Medication list has been reviewed and updated.  Current Outpatient Medications on File Prior to Visit  Medication Sig Dispense Refill  . Amphet-Dextroamphet 3-Bead ER 37.5 MG CP24 Take 1 capsule by mouth daily.     Marland Kitchen emtricitabine-tenofovir (TRUVADA) 200-300 MG tablet Take 1 tablet by mouth daily. 90 tablet 1  . FLUoxetine (PROZAC) 20 MG capsule Take 60 mg by mouth at bedtime.     . propranolol (INDERAL) 10 MG tablet Take 1 tablet (10 mg total) by mouth 4 (four) times daily as needed. 60 tablet 11   No current facility-administered medications on file prior to visit.     Review of Systems:  As per HPI- otherwise negative.   Physical Examination: Vitals:   12/27/18 1528  BP: 122/80  Pulse: 90  Resp:  16  Temp: 98 F (36.7 C)  SpO2: 99%   Vitals:   12/27/18 1528  Weight: 198 lb (89.8 kg)  Height: 6\' 1"  (1.854 m)   Body mass index is 26.12 kg/m. Ideal Body Weight: Weight in (lb) to have BMI = 25: 189.1  GEN: WDWN, NAD, Non-toxic, A & O x 3, mild overweight, looks well  HEENT: Atraumatic, Normocephalic. Neck supple. No masses, No LAD. Ears and Nose: No external deformity. CV: RRR, No M/G/R. No JVD. No thrill. No extra heart sounds. PULM: CTA B, no wheezes, crackles, rhonchi. No retractions. No resp. distress. No accessory muscle use. EXTR: No c/c/e NEURO Normal gait.  PSYCH: Normally interactive. Conversant. Not depressed or anxious  appearing.  Calm demeanor.    Assessment and Plan: Exposure to TB - Plan: QuantiFERON-TB Gold Plus, CBC, DG Chest 2 View, CANCELED: DG Chest 2 View  Screening for diabetes mellitus - Plan: Hemoglobin A1c  Screening for hypercholesterolemia - Plan: Lipid panel  Screening for HIV (human immunodeficiency virus) - Plan: HIV Antibody (routine testing w rflx)  Tyler Chase is here today with potential exposure to tuberculosis.  His boyfriend was recently found to have a positive?  QuantiFERON gold test.  They do not live together but spent a lot of time together.  Tyler Chase still not entirely clear on his boyfriends outlook, we are not sure if he will need treatment.  He is not sure if he had a chest x-ray yet. Tyler Chase has not had any symptoms of TB.  We will do a QuantiFERON gold and chest x-ray from today.  We will also go ahead and do routine screening labs as above.  We will keep in touch of each other, as we find out more about his partners treatment plan.  Suspect that we may need to extend you for the health department for prophylactic treatment.  Signed Lamar Blinks, MD  addnd 1/16- message to Tyler Chase with labs so far CXR sill pending   Results for orders placed or performed in visit on 12/27/18  HIV Antibody (routine testing w rflx)  Result Value Ref Range   HIV 1&2 Ab, 4th Generation NON-REACTIVE NON-REACTI  QuantiFERON-TB Gold Plus  Result Value Ref Range   QuantiFERON-TB Gold Plus NEGATIVE NEGATIVE   NIL 0.03 IU/mL   Mitogen-NIL 7.37 IU/mL   TB1-NIL <0.00 IU/mL   TB2-NIL 0.00 IU/mL  CBC  Result Value Ref Range   WBC 6.4 4.0 - 10.5 K/uL   RBC 6.26 (H) 4.22 - 5.81 Mil/uL   Platelets 368.0 150.0 - 400.0 K/uL   Hemoglobin 14.1 13.0 - 17.0 g/dL   HCT 43.3 39.0 - 52.0 %   MCV 69.1 (L) 78.0 - 100.0 fl   MCHC 32.5 30.0 - 36.0 g/dL   RDW 16.2 (H) 11.5 - 15.5 %  Lipid panel  Result Value Ref Range   Cholesterol 178 0 - 200 mg/dL   Triglycerides 75.0 0.0 - 149.0 mg/dL   HDL 41.50  >39.00 mg/dL   VLDL 15.0 0.0 - 40.0 mg/dL   LDL Cholesterol 122 (H) 0 - 99 mg/dL   Total CHOL/HDL Ratio 4    NonHDL 136.91   Hemoglobin A1c  Result Value Ref Range   Hgb A1c MFr Bld 5.5 4.6 - 6.5 %

## 2018-12-27 ENCOUNTER — Encounter: Payer: Self-pay | Admitting: Family Medicine

## 2018-12-27 ENCOUNTER — Ambulatory Visit: Payer: BC Managed Care – PPO | Admitting: Family Medicine

## 2018-12-27 VITALS — BP 122/80 | HR 90 | Temp 98.0°F | Resp 16 | Ht 73.0 in | Wt 198.0 lb

## 2018-12-27 DIAGNOSIS — Z13220 Encounter for screening for lipoid disorders: Secondary | ICD-10-CM

## 2018-12-27 DIAGNOSIS — Z131 Encounter for screening for diabetes mellitus: Secondary | ICD-10-CM

## 2018-12-27 DIAGNOSIS — Z114 Encounter for screening for human immunodeficiency virus [HIV]: Secondary | ICD-10-CM | POA: Diagnosis not present

## 2018-12-27 DIAGNOSIS — Z201 Contact with and (suspected) exposure to tuberculosis: Secondary | ICD-10-CM

## 2018-12-27 NOTE — Patient Instructions (Signed)
It was good to see you today, will work on sorting out this TB concern.  Today we will get a blood screening for you, I will also get your other routine blood tests while we are at it Please go to [facility] imaging, which is on [address] near the hospital, at your earliest convenience.  They will do a chest x-ray for [place].  We will then likely need to consult with the tuberculosis staff at the health department to determine if you need therapy

## 2018-12-28 ENCOUNTER — Encounter: Payer: Self-pay | Admitting: Family Medicine

## 2018-12-28 LAB — LIPID PANEL
CHOL/HDL RATIO: 4
CHOLESTEROL: 178 mg/dL (ref 0–200)
HDL: 41.5 mg/dL (ref >39.00)
LDL CALC: 122 mg/dL — AB (ref 0–99)
NonHDL: 136.91
TRIGLYCERIDES: 75 mg/dL (ref 0.0–149.0)
VLDL: 15 mg/dL (ref 0.0–40.0)

## 2018-12-28 LAB — HEMOGLOBIN A1C: Hgb A1c MFr Bld: 5.5 % (ref 4.6–6.5)

## 2018-12-28 LAB — CBC
HCT: 43.3 % (ref 39.0–52.0)
Hemoglobin: 14.1 g/dL (ref 13.0–17.0)
MCHC: 32.5 g/dL (ref 30.0–36.0)
MCV: 69.1 fl — AB (ref 78.0–100.0)
PLATELETS: 368 10*3/uL (ref 150.0–400.0)
RBC: 6.26 Mil/uL — AB (ref 4.22–5.81)
RDW: 16.2 % — ABNORMAL HIGH (ref 11.5–15.5)
WBC: 6.4 10*3/uL (ref 4.0–10.5)

## 2018-12-29 LAB — QUANTIFERON-TB GOLD PLUS
MITOGEN-NIL: 7.37 [IU]/mL
NIL: 0.03 [IU]/mL
QuantiFERON-TB Gold Plus: NEGATIVE
TB2-NIL: 0 IU/mL

## 2018-12-29 LAB — HIV ANTIBODY (ROUTINE TESTING W REFLEX): HIV 1&2 Ab, 4th Generation: NONREACTIVE

## 2018-12-30 ENCOUNTER — Encounter: Payer: Self-pay | Admitting: Family Medicine

## 2019-01-16 IMAGING — DX DG CHEST 2V
2 series · 2 of 2 positions shown · non-contrast
Comparison: None.

CLINICAL DATA: Shortness of breath.

EXAM:
CHEST  2 VIEW

[chest pa]
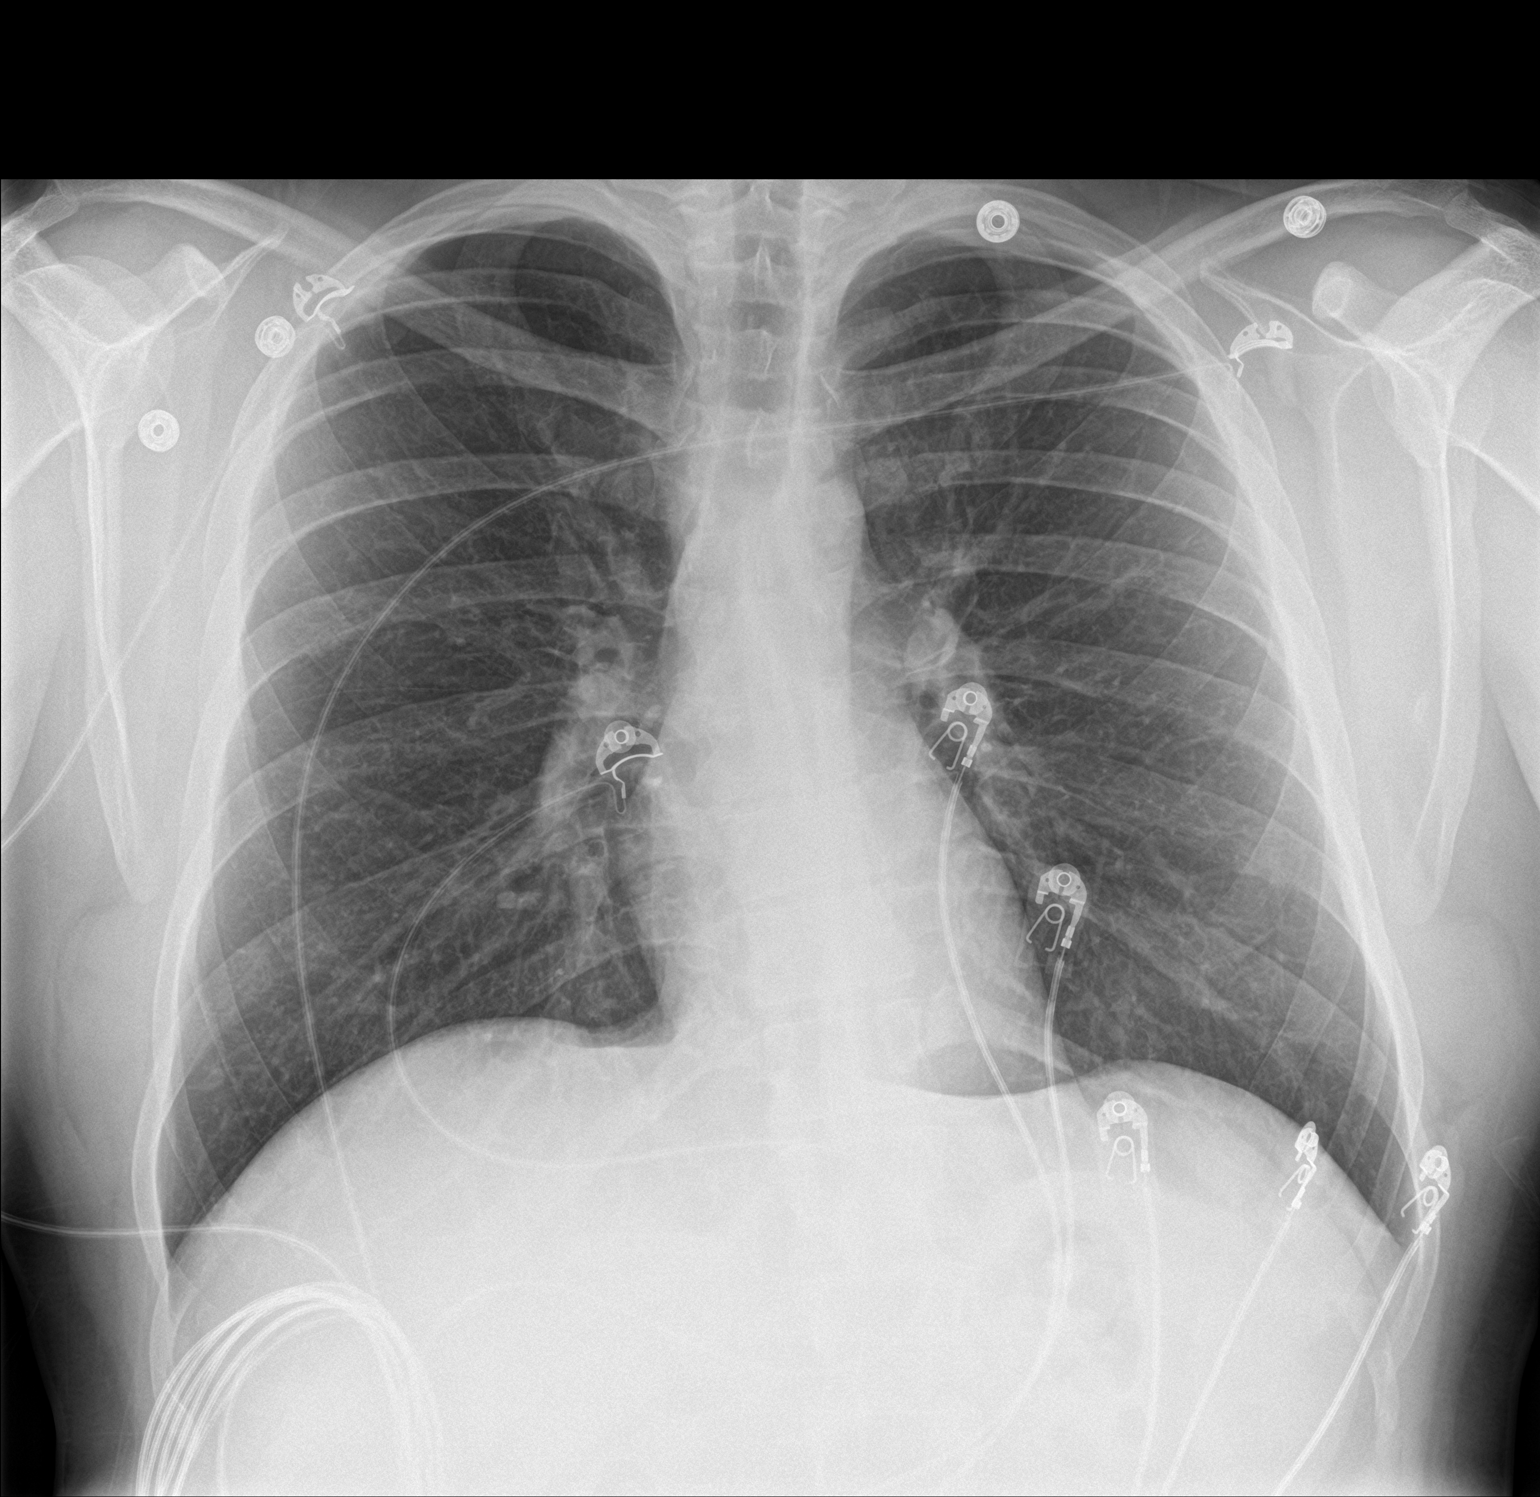

[chest lat]
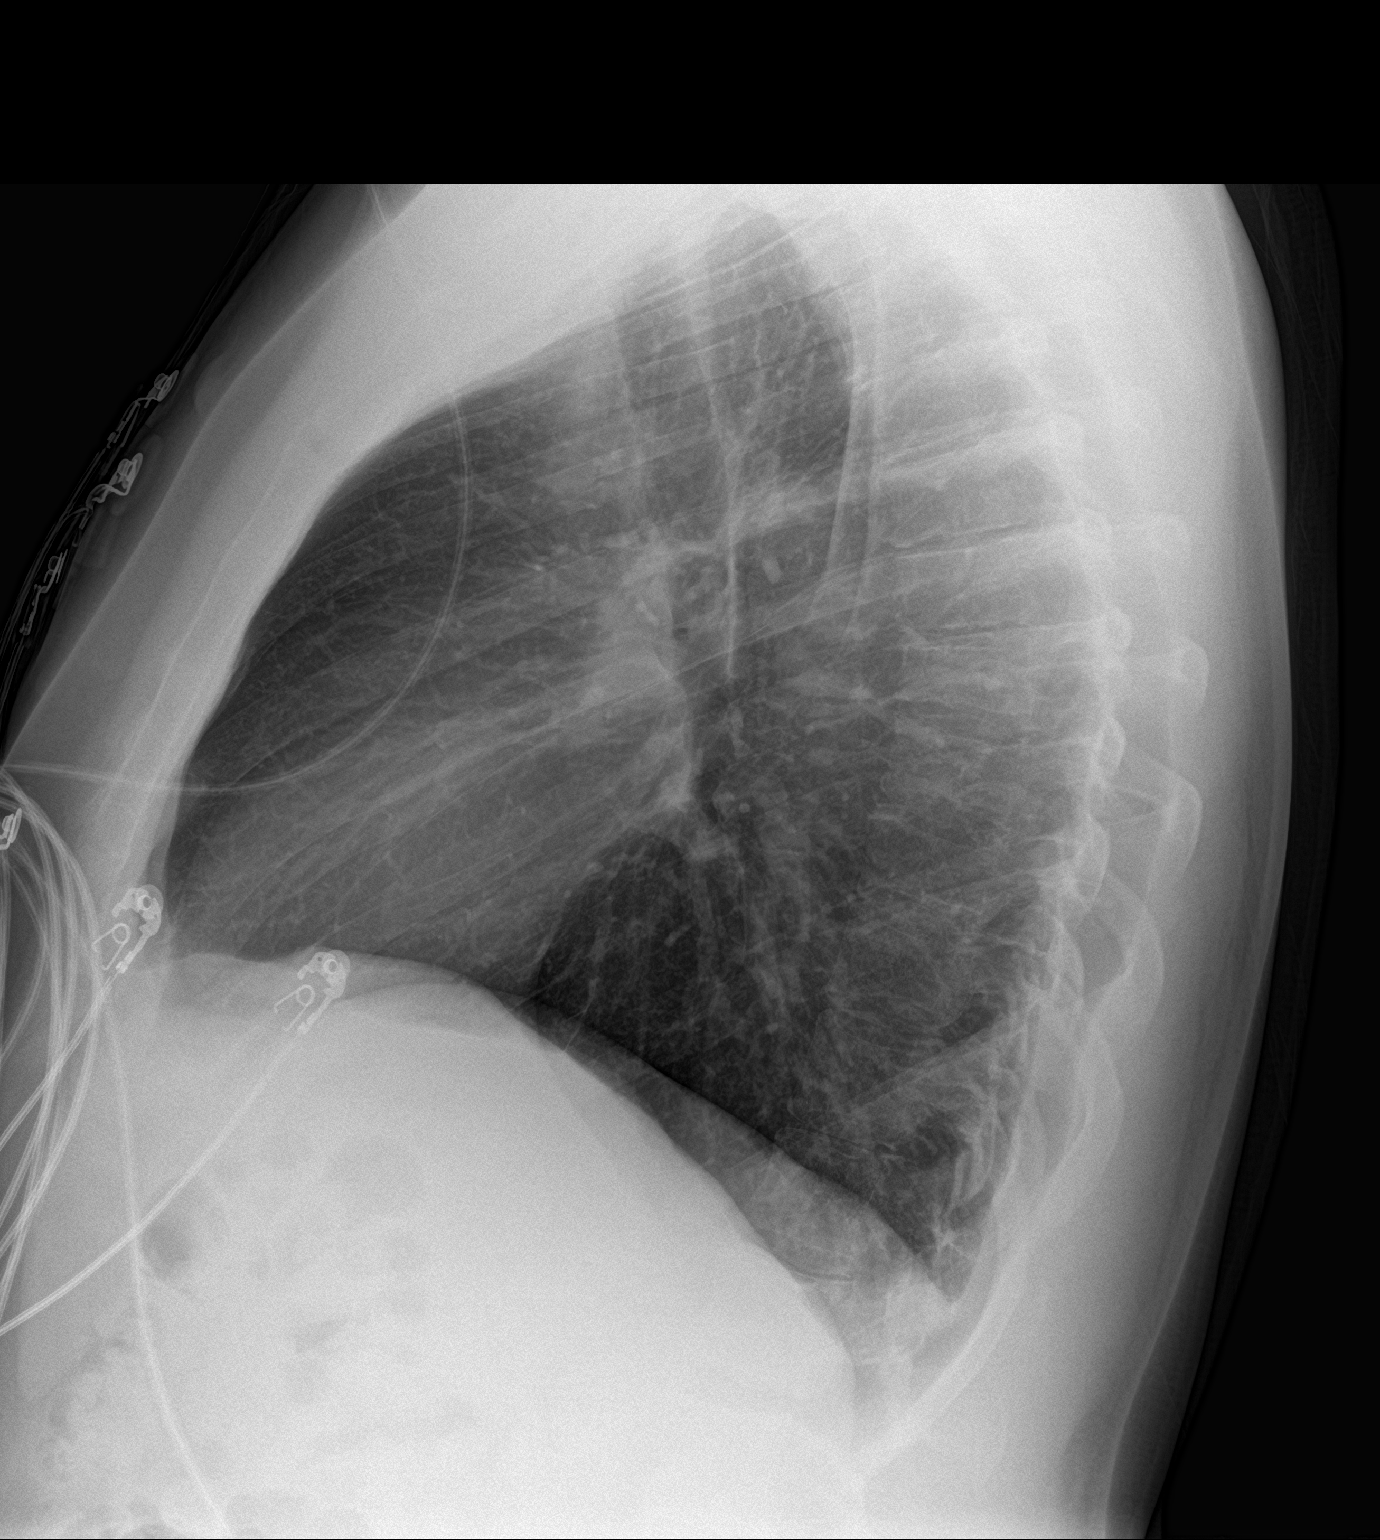

[2 of 2 positions shown; findings below may reference images not displayed]

FINDINGS: The heart size and mediastinal contours are within normal limits.
Both lungs are clear. No pneumothorax or pleural effusion is noted.
The visualized skeletal structures are unremarkable.
IMPRESSION: No active cardiopulmonary disease.

## 2019-01-31 ENCOUNTER — Ambulatory Visit: Payer: BC Managed Care – PPO | Admitting: Family Medicine

## 2019-02-15 NOTE — Progress Notes (Addendum)
[facility] at [employer] [facility] [address], [place], [place] [address] [phone]  Date:  02/17/2019   Name:  Tyler Chase   DOB:  [birth year]   MRN:  [id number]  PCP:  Darreld Mclean, MD    Chief Complaint: Elbow Pain (left elbow pain, off and on year, no injury, no swelling or bruising)   History of Present Illness:  Tyler Chase is a 33 y.o. very pleasant male patient who presents with the following:  Here today with concern of elbow pain- left He has noted it since the summer Will wax and wane Worst in the am generally  Flexing the elbow when weighted is painful -such as when holding a coffee cup No injury He is not doing any new activities, he is not playing tennis He has tried some topical analgesics, ibuprofen, tumeric, ice wrap  Right elbow is ok  He is right handed   He is also noted a pain at the base of his left index finger, for about the same period of time.  He is not aware of any injury.  No stiffness.  The area is minimally swollen, and feels tender when he presses directly on it  Health maintenance reviewed and up-to-date  No systemic symptoms.  No fever chills His significant other was diagnosed with latent TB.  The patient himself had a negative QuantiFERON gold, and has not been instructed to take any treatment  Mild tachycardia is baseline for this patient.  Uses Inderal as needed for palpitations Pulse Readings from Last 3 Encounters:  02/17/19 (!) 102  12/27/18 90  03/01/18 (!) 102    Patient Active Problem List   Diagnosis Date Noted  . Mixed hyperlipidemia 09/11/2017  . SVT (supraventricular tachycardia) ([place]) 09/11/2017  . Beta thalassemia minor 09/11/2017  . Hepatitis B immune 04/24/2016  . Benign essential tremor 05/18/2015  . Allergic rhinitis 05/09/2015  . Migraine with aura and without status migrainosus, not intractable 08/17/2014  . Tinea versicolor 03/10/2014  . Internal  hemorrhoids with other complication 70/26/3785  . Anal fissure 11/02/2013  . Insomnia 10/14/2013  . Generalized anxiety disorder 10/14/2013  . GERD (gastroesophageal reflux disease) 10/14/2013  . BRBPR (bright red blood per rectum) 10/14/2013  . Elimination disorder with fecal symptoms 10/14/2013    Past Medical History:  Diagnosis Date  . Anemia   . Anxiety   . Depression   . Generalized headaches   . GERD (gastroesophageal reflux disease)   . Tendonitis     No past surgical history on file.  Social History   Tobacco Use  . Smoking status: Never Smoker  . Smokeless tobacco: Never Used  Substance Use Topics  . Alcohol use: Yes    Comment: social  . Drug use: No    Family History  Problem Relation Age of Onset  . Arthritis Mother   . Migraines Mother   . Lymphoma Father   . Mental illness Maternal Uncle        great uncle  . Heart disease Maternal Grandfather   . Hypertension Maternal Grandfather     Allergies  Allergen Reactions  . Food     "nitrate meats"= gives migraines  . Ilosone [Erythromycin]     "childhood reaction"  . Niacin And Related Hives    Medication list has been reviewed and updated.  Current Outpatient Medications on File Prior to Visit  Medication Sig Dispense Refill  . Amphet-Dextroamphet 3-Bead ER 37.5  MG CP24 Take 1 capsule by mouth daily.     Marland Kitchen emtricitabine-tenofovir (TRUVADA) 200-300 MG tablet Take 1 tablet by mouth daily. 90 tablet 1  . FLUoxetine (PROZAC) 20 MG capsule Take 60 mg by mouth at bedtime.     . propranolol (INDERAL) 10 MG tablet Take 1 tablet (10 mg total) by mouth 4 (four) times daily as needed. 60 tablet 11   No current facility-administered medications on file prior to visit.     Review of Systems:  As per HPI- otherwise negative.   Physical Examination: Vitals:   02/17/19 1302  BP: 126/78  Pulse: (!) 102  Resp: 16  Temp: 98 F (36.7 C)  SpO2: 99%   Vitals:   02/17/19 1302  Weight: 201 lb (91.2  kg)  Height: 6\' 1"  (1.854 m)   Body mass index is 26.52 kg/m. Ideal Body Weight: Weight in (lb) to have BMI = 25: 189.1  GEN: WDWN, NAD, Non-toxic, A & O x 3, slight overweight, tall build.  Looks well HEENT: Atraumatic, Normocephalic. Neck supple. No masses, No LAD. Ears and Nose: No external deformity. CV: RRR, No M/G/R. No JVD. No thrill. No extra heart sounds. PULM: CTA B, no wheezes, crackles, rhonchi. No retractions. No resp. distress. No accessory muscle use. EXTR: No c/c/e NEURO Normal gait.  PSYCH: Normally interactive. Conversant. Not depressed or anxious appearing.  Calm demeanor.  He has mild tenderness over the left lateral epicondyle.  He has pain with resisted flexion of the elbow.  Resisted pronation and supination are not tender.  Also mild tenderness at the inferior triceps insert Tatian.  No swelling or redness over the lateral epicondyle He is mildly tender at the base of the left index finger.  Normal range of motion of the joints, no trigger finger.  There is perhaps minimal swelling at the base of the finger.  No redness or heat  Assessment and Plan: Lateral epicondylitis of left elbow - Plan: Ambulatory referral to Orthopedic Surgery  Arthralgia of right hand - Plan: DG Hand Complete Left, Ambulatory referral to Orthopedic Surgery  Lateral epicondylitis of the left, this has been present for 6 months.  We had thought of using meloxicam, but this can interact with some of his other medications.  I gave him some exercises to work on, and will refer to orthopedic/sports medicine for evaluation We will obtain x-ray of his hand, and follow-up with him pending report.  We will also ask for his medicine to evaluate this issue  Signed Lamar Blinks, MD  Received his finger film  Dg Hand Complete Left  Result Date: 02/17/2019 CLINICAL DATA:  Pain and swelling at the base of the left index finger for the past year. No reported injury. EXAM: LEFT HAND - COMPLETE 3+ VIEW  COMPARISON:  None. FINDINGS: Mild diffuse proximal soft tissue swelling in the proximal left index finger. The bones have normal appearances. IMPRESSION: Proximal left index finger soft tissue swelling without underlying bony abnormality. Electronically Signed   By: Claudie Revering M.D.   On: 02/17/2019 20:34   Message to pt

## 2019-02-17 ENCOUNTER — Ambulatory Visit (HOSPITAL_BASED_OUTPATIENT_CLINIC_OR_DEPARTMENT_OTHER)
Admission: RE | Admit: 2019-02-17 | Discharge: 2019-02-17 | Disposition: A | Discharge status: Home / Self Care | Payer: BC Managed Care – PPO | Source: Ambulatory Visit | Attending: Family Medicine | Admitting: Family Medicine

## 2019-02-17 ENCOUNTER — Ambulatory Visit: Payer: BC Managed Care – PPO | Admitting: Family Medicine

## 2019-02-17 ENCOUNTER — Encounter: Payer: Self-pay | Admitting: Family Medicine

## 2019-02-17 VITALS — BP 126/78 | HR 102 | Temp 98.0°F | Resp 16 | Ht 73.0 in | Wt 201.0 lb

## 2019-02-17 DIAGNOSIS — M7712 Lateral epicondylitis, left elbow: Secondary | ICD-10-CM

## 2019-02-17 DIAGNOSIS — M25541 Pain in joints of right hand: Secondary | ICD-10-CM | POA: Insufficient documentation

## 2019-02-17 NOTE — Patient Instructions (Signed)
We will refer you to orthopedics to look at your elbow and finger Please have x-rays done in the ground floor imaging suite today-  I will be in touch with your results asap   You can work on some of the exercises in your hand-out in the meantime   [facility] Address: [address], [place], [place] [address]  Phone: [phone]

## 2019-05-16 ENCOUNTER — Other Ambulatory Visit: Payer: Self-pay | Admitting: Family Medicine

## 2019-05-16 DIAGNOSIS — Z206 Contact with and (suspected) exposure to human immunodeficiency virus [HIV]: Secondary | ICD-10-CM

## 2019-09-25 ENCOUNTER — Telehealth: Payer: BC Managed Care – PPO | Admitting: Family

## 2019-09-25 DIAGNOSIS — H66002 Acute suppurative otitis media without spontaneous rupture of ear drum, left ear: Secondary | ICD-10-CM | POA: Diagnosis not present

## 2019-09-25 MED ORDER — AMOXICILLIN 875 MG PO TABS: 875.0000 mg | ORAL_TABLET | Freq: Two times a day (BID) | ORAL | 0 refills | Status: DC

## 2019-09-25 NOTE — Progress Notes (Signed)
E Visit for Ear infection  We are sorry that you are not feeling well. Here is how we plan to help!  It seems like you have ear infection. I have sent in Amoxicillin 875 mg twice a day for 7 days.   Your symptoms should improve over the next 3 days and should resolve in about 7 days.  HOME CARE:   Wash your hands frequently.  Do not place the tip of the bottle on your ear or touch it with your fingers.  You can take Acetominophen 650 mg every 4-6 hours as needed for pain.  If pain is severe or moderate, you can apply a heating pad (set on low) or hot water bottle (wrapped in a towel) to outer ear for 20 minutes.  This will also increase drainage.  Avoid ear plugs  Do not use Q-tips  After showers, help the water run out by tilting your head to one side.  GET HELP RIGHT AWAY IF:   Fever is over 102.2 degrees.  You develop progressive ear pain or hearing loss.  Ear symptoms persist longer than 3 days after treatment.  MAKE SURE YOU:   Understand these instructions.  Will watch your condition.  Will get help right away if you are not doing well or get worse.  TO PREVENT SWIMMER'S EAR:  Use a bathing cap or custom fitted swim molds to keep your ears dry.  Towel off after swimming to dry your ears.  Tilt your head or pull your earlobes to allow the water to escape your ear canal.  If there is still water in your ears, consider using a hairdryer on the lowest setting.  Thank you for choosing an e-visit. Your e-visit answers were reviewed by a board certified advanced clinical practitioner to complete your personal care plan. Depending upon the condition, your plan could have included both over the counter or prescription medications. Please review your pharmacy choice. Be sure that the pharmacy you have chosen is open so that you can pick up your prescription now.  If there is a problem you may message your provider in [place] to have the prescription routed to another  pharmacy. Your safety is important to [place]. If you have drug allergies check your prescription carefully.  For the next 24 hours, you can use MyChart to ask questions about today's visit, request a non-urgent call back, or ask for a work or school excuse from your e-visit provider. You will get an email in the next two days asking about your experience. I hope that your e-visit has been valuable and will speed your recovery.      Approximately 5 minutes was spent documenting and reviewing patient's chart.

## 2019-10-09 ENCOUNTER — Telehealth: Payer: BC Managed Care – PPO | Admitting: Family

## 2019-10-09 DIAGNOSIS — H65194 Other acute nonsuppurative otitis media, recurrent, right ear: Secondary | ICD-10-CM

## 2019-10-09 NOTE — Progress Notes (Signed)
Based on what you shared with me, I feel your condition warrants further evaluation and I recommend that you be seen for a face to face office visit.  Given that your symptoms have returned, you need to be seen face to face for someone to look into your ear.   NOTE: If you entered your credit card information for this eVisit, you will not be charged. You may see a "hold" on your card for the $35 but that hold will drop off and you will not have a charge processed.  If you are having a true medical emergency please call 911.     For an urgent face to face visit, [place] has four urgent care centers for your convenience:   . [facility]    [phone]                  Get Driving Directions  [id number] [place], [place] [address] . 10 am to 8 pm Monday-Friday . 12 pm to 8 pm Saturday-Sunday   . [facility] at [place]                  Get Driving Directions  [phone] [place], [place], [place] [address] . 8 am to 8 pm Monday-Friday . 9 am to 6 pm Saturday . 11 am to 6 pm Sunday   . [facility] at [place]                  Get Driving Directions   [address] [place], [place] [address] . 8 am to 8 pm Monday-Friday . 8 am to 4 pm Saturday-Sunday    . [facility] at [place]                    Get Driving Directions  [id number]  [address] [place], [place] [address]  . Monday-Friday, 12 PM to 6 PM    Your e-visit answers were reviewed by a board certified advanced clinical practitioner to complete your personal care plan.  Thank you for using e-Visits.

## 2019-10-10 ENCOUNTER — Ambulatory Visit (INDEPENDENT_AMBULATORY_CARE_PROVIDER_SITE_OTHER): Payer: BC Managed Care – PPO | Admitting: Medical

## 2019-10-10 ENCOUNTER — Other Ambulatory Visit: Payer: Self-pay

## 2019-10-10 VITALS — Wt 199.0 lb

## 2019-10-10 DIAGNOSIS — H938X1 Other specified disorders of right ear: Secondary | ICD-10-CM

## 2019-10-10 DIAGNOSIS — H9201 Otalgia, right ear: Secondary | ICD-10-CM

## 2019-10-10 DIAGNOSIS — R0981 Nasal congestion: Secondary | ICD-10-CM

## 2019-10-10 MED ORDER — FLUTICASONE PROPIONATE 50 MCG/ACT NA SUSP: 2.0000 | Freq: Every day | NASAL | 1 refills | Status: DC

## 2019-10-10 NOTE — Patient Instructions (Addendum)
You do have some recent ear pressure, nasal congestion and ear pain.  You failed treatment with plain amoxicillin and your ears have not been checked since you only have ED visits.  Today's visit was virtual but was unable to visualize her ear.  At this point I do think it would be very important to actually look at the tympanic membrane or to even see if you might have some wax present.  Going to advise that you use Flonase nasal spray presently to see if you might have some eustachian tube pressure.  Going to arrange for you to come to our office on Wednesday afternoon at 1 PM.  I want you to stay in the car and call us when you get here.  I will go downstairs with otoscope so I can look inside your ear canal.  This will help with to know if I need to give stronger antibiotic or if you might have cerumen impaction.   Presently with Covid restrictions and your recent mild nasal congestion, I do not think I can see you in the office just yet.  You I do not think based on your symptoms that you need Covid testing.  Complete plan to be determined after visualizing your ear on Wednesday afternoon.   Closing note but will addendum on Wed when ear examined.  Pt did drive here to office 10/28. Tm's both appear normal. No tragal tenderness on rt side. No posterior auricle pain. No sinus pressure. Do think eustachian tube dysfunction. He has not started flonase yet. Advised to start and then update me on this coming Monday. Note ear pressure mild intermittent late in the day. Nasal congestion had other day now resolved.

## 2019-10-10 NOTE — Progress Notes (Signed)
Subjective:    Patient ID: Tyler Chase, male    DOB: 1986/02/03, 33 y.o.   MRN: [id number]  HPI  Virtual Visit via Video Note  I connected with Tyler Chase on 10/10/19 at  3:20 PM EDT by a video enabled telemedicine application and verified that I am speaking with the correct person using two identifiers.  Location: Patient: home Provider: office   I discussed the limitations of evaluation and management by telemedicine and the availability of in person appointments. The patient expressed understanding and agreed to proceed.  History of Present Illness:  Pt had some recent rt ear pressure since Sep 25, 2019. Pt state took amoxicillin for 10 days. Pt states it did feel better but not completely. Then 2-3 days ago pain in ear seems to be reoccuring. Pt has no st. No sinus pressure. No fever, no chills, no sweats, and no bodyaches.      Observations/Objective: General-no acute distress, pleasant, oriented. Lungs- on inspection lungs appear unlabored. Neck- no tracheal deviation or jvd on inspection. Neuro- gross motor function appears intact.  Assessment and Plan: See below A and P.  Follow Up Instructions:    I discussed the assessment and treatment plan with the patient. The patient was provided an opportunity to ask questions and all were answered. The patient agreed with the plan and demonstrated an understanding of the instructions.   The patient was advised to call back or seek an in-person evaluation if the symptoms worsen or if the condition fails to improve as anticipated.  I provided 25 minutes of non-face-to-face time during this encounter.   Mackie Pai, PA-C   Review of Systems  Constitutional: Negative for chills, fatigue and fever.  HENT: Positive for congestion and ear pain. Negative for nosebleeds, sinus pressure, sinus pain, sore throat and trouble swallowing.        A couple of times at night feels nasal congested.   His ear feels clogged.   Respiratory: Negative for chest tightness, shortness of breath and wheezing.   Cardiovascular: Negative for chest pain and palpitations.  Gastrointestinal: Negative for abdominal distention, abdominal pain, constipation, diarrhea, rectal pain and vomiting.  Musculoskeletal: Negative for back pain, joint swelling and neck stiffness.  Skin: Negative for rash.  Neurological: Negative for dizziness, seizures, weakness and headaches.  Hematological: Negative for adenopathy. Does not bruise/bleed easily.  Psychiatric/Behavioral: Negative for behavioral problems, confusion, hallucinations and suicidal ideas. The patient is not nervous/anxious.     Past Medical History:  Diagnosis Date  . Anemia   . Anxiety   . Depression   . Generalized headaches   . GERD (gastroesophageal reflux disease)   . Tendonitis      Social History   Socioeconomic History  . Marital status: Single    Spouse name: Not on file  . Number of children: 0  . Years of education: Not on file  . Highest education level: Not on file  Occupational History  . Occupation: Product manager: Orthoptist  Social Needs  . Financial resource strain: Not on file  . Food insecurity    Worry: Not on file    Inability: Not on file  . Transportation needs    Medical: Not on file    Non-medical: Not on file  Tobacco Use  . Smoking status: Never Smoker  . Smokeless tobacco: Never Used  Substance and Sexual Activity  . Alcohol use: Yes    Comment: social  . Drug use: No  .  Sexual activity: Yes    Partners: Female  Lifestyle  . Physical activity    Days per week: Not on file    Minutes per session: Not on file  . Stress: Not on file  Relationships  . Social Herbalist on phone: Not on file    Gets together: Not on file    Attends religious service: Not on file    Active member of club or organization: Not on file    Attends meetings of clubs or organizations: Not on file     Relationship status: Not on file  . Intimate partner violence    Fear of current or ex partner: Not on file    Emotionally abused: Not on file    Physically abused: Not on file    Forced sexual activity: Not on file  Other Topics Concern  . Not on file  Social History Narrative  . Not on file    No past surgical history on file.  Family History  Problem Relation Age of Onset  . Arthritis Mother   . Migraines Mother   . Lymphoma Father   . Mental illness Maternal Uncle        great uncle  . Heart disease Maternal Grandfather   . Hypertension Maternal Grandfather     Allergies  Allergen Reactions  . Food     "nitrate meats"= gives migraines  . Ilosone [Erythromycin]     "childhood reaction"  . Niacin And Related Hives    Current Outpatient Medications on File Prior to Visit  Medication Sig Dispense Refill  . amoxicillin (AMOXIL) 875 MG tablet Take 1 tablet (875 mg total) by mouth 2 (two) times daily. 14 tablet 0  . Amphet-Dextroamphet 3-Bead ER 37.5 MG CP24 Take 1 capsule by mouth daily.     Marland Kitchen FLUoxetine (PROZAC) 20 MG capsule Take 60 mg by mouth at bedtime.     . propranolol (INDERAL) 10 MG tablet Take 1 tablet (10 mg total) by mouth 4 (four) times daily as needed. 60 tablet 11  . TRUVADA 200-300 MG tablet TAKE 1 TABLET BY MOUTH DAILY. 90 tablet 1   No current facility-administered medications on file prior to visit.     Wt 199 lb (90.3 kg)   BMI 26.25 kg/m       Objective:   Physical Exam  General-no acute distress, pleasant, oriented. Lungs- on inspection lungs appear unlabored. Neck- no tracheal deviation or jvd on inspection. Neuro- gross motor function appears intact. heent- no sinus pressure.      Assessment & Plan:  You do have some recent ear pressure, nasal congestion and ear pain.  You failed treatment with plain amoxicillin and your ears have not been checked since you only have ED visits.  Today's visit was virtual but was unable to visualize  her ear.  At this point I do think it would be very important to actually look at the tympanic membrane or to even see if you might have some wax present.  Going to advise that you use Flonase nasal spray presently to see if you might have some eustachian tube pressure.  Going to arrange for you to come to our office on Wednesday afternoon at 1 PM.  I want you to stay in the car and call us when you get here.  I will go downstairs with otoscope so I can look inside your ear canal.  This will help with to know if I need to give  stronger antibiotic or if you might have cerumen impaction.   Presently with Covid restrictions and your recent mild nasal congestion, I do not think I can see you in the office just yet.  You I do not think based on your symptoms that you need Covid testing.  Complete plan to be determined after visualizing your ear on Wednesday afternoon.  Closing note but will addendum on Wed when ear examined.  Mackie Pai, PA-C

## 2019-10-12 ENCOUNTER — Encounter: Payer: Self-pay | Admitting: Medical

## 2019-10-12 ENCOUNTER — Ambulatory Visit: Payer: BC Managed Care – PPO | Admitting: Family Medicine

## 2019-10-12 NOTE — Telephone Encounter (Signed)
Pt called in and phone line disconnected. No answer on call back so it may be dead. Informed Percell Miller pt is in the parking lot at [facility] and in red [employer]. He will be down upon completing current visit.

## 2020-02-16 ENCOUNTER — Ambulatory Visit: Payer: BC Managed Care – PPO | Attending: Family

## 2020-02-16 DIAGNOSIS — Z23 Encounter for immunization: Secondary | ICD-10-CM | POA: Insufficient documentation

## 2020-02-16 NOTE — Progress Notes (Signed)
   Covid-19 Vaccination Clinic  Name:  Leiland Quiggle    MRN: [id number] DOB: 08-May-1986  02/16/2020  Mr. Padget was observed post Covid-19 immunization for 15 minutes without incident. He was provided with Vaccine Information Sheet and instruction to access the V-Safe system.   Mr. Rabe was instructed to call 911 with any severe reactions post vaccine: Marland Kitchen Difficulty breathing  . Swelling of face and throat  . A fast heartbeat  . A bad rash all over body  . Dizziness and weakness   Immunizations Administered    Name Date Dose VIS Date Route   Moderna COVID-19 Vaccine 02/16/2020  2:38 PM 0.5 mL 11/15/2019 Intramuscular   Manufacturer: Moderna   Lot: [id number]   [id number]

## 2020-03-20 ENCOUNTER — Ambulatory Visit: Payer: BC Managed Care – PPO | Attending: Family

## 2020-03-20 DIAGNOSIS — Z23 Encounter for immunization: Secondary | ICD-10-CM

## 2020-03-20 NOTE — Progress Notes (Signed)
   Covid-19 Vaccination Clinic  Name:  Tyler Chase    MRN: [id number] DOB: 1986-05-08  03/20/2020  [name] was observed post Covid-19 immunization for 15 minutes without incident. He was provided with Vaccine Information Sheet and instruction to access the V-Safe system.   [name] was instructed to call 911 with any severe reactions post vaccine: Marland Kitchen Difficulty breathing  . Swelling of face and throat  . A fast heartbeat  . A bad rash all over body  . Dizziness and weakness   Immunizations Administered    Name Date Dose VIS Date Route   Moderna COVID-19 Vaccine 03/20/2020  1:14 PM 0.5 mL 11/15/2019 Intramuscular   Manufacturer: Moderna   Lot: [id number]   [id number]

## 2020-07-08 IMAGING — DX DG HAND COMPLETE 3+V*L*
3 series · 3 of 3 positions shown · non-contrast
Comparison: None.

CLINICAL DATA: Pain and swelling at the base of the left index
finger for the past year. No reported injury.

EXAM:
LEFT HAND - COMPLETE 3+ VIEW

[hand pa]
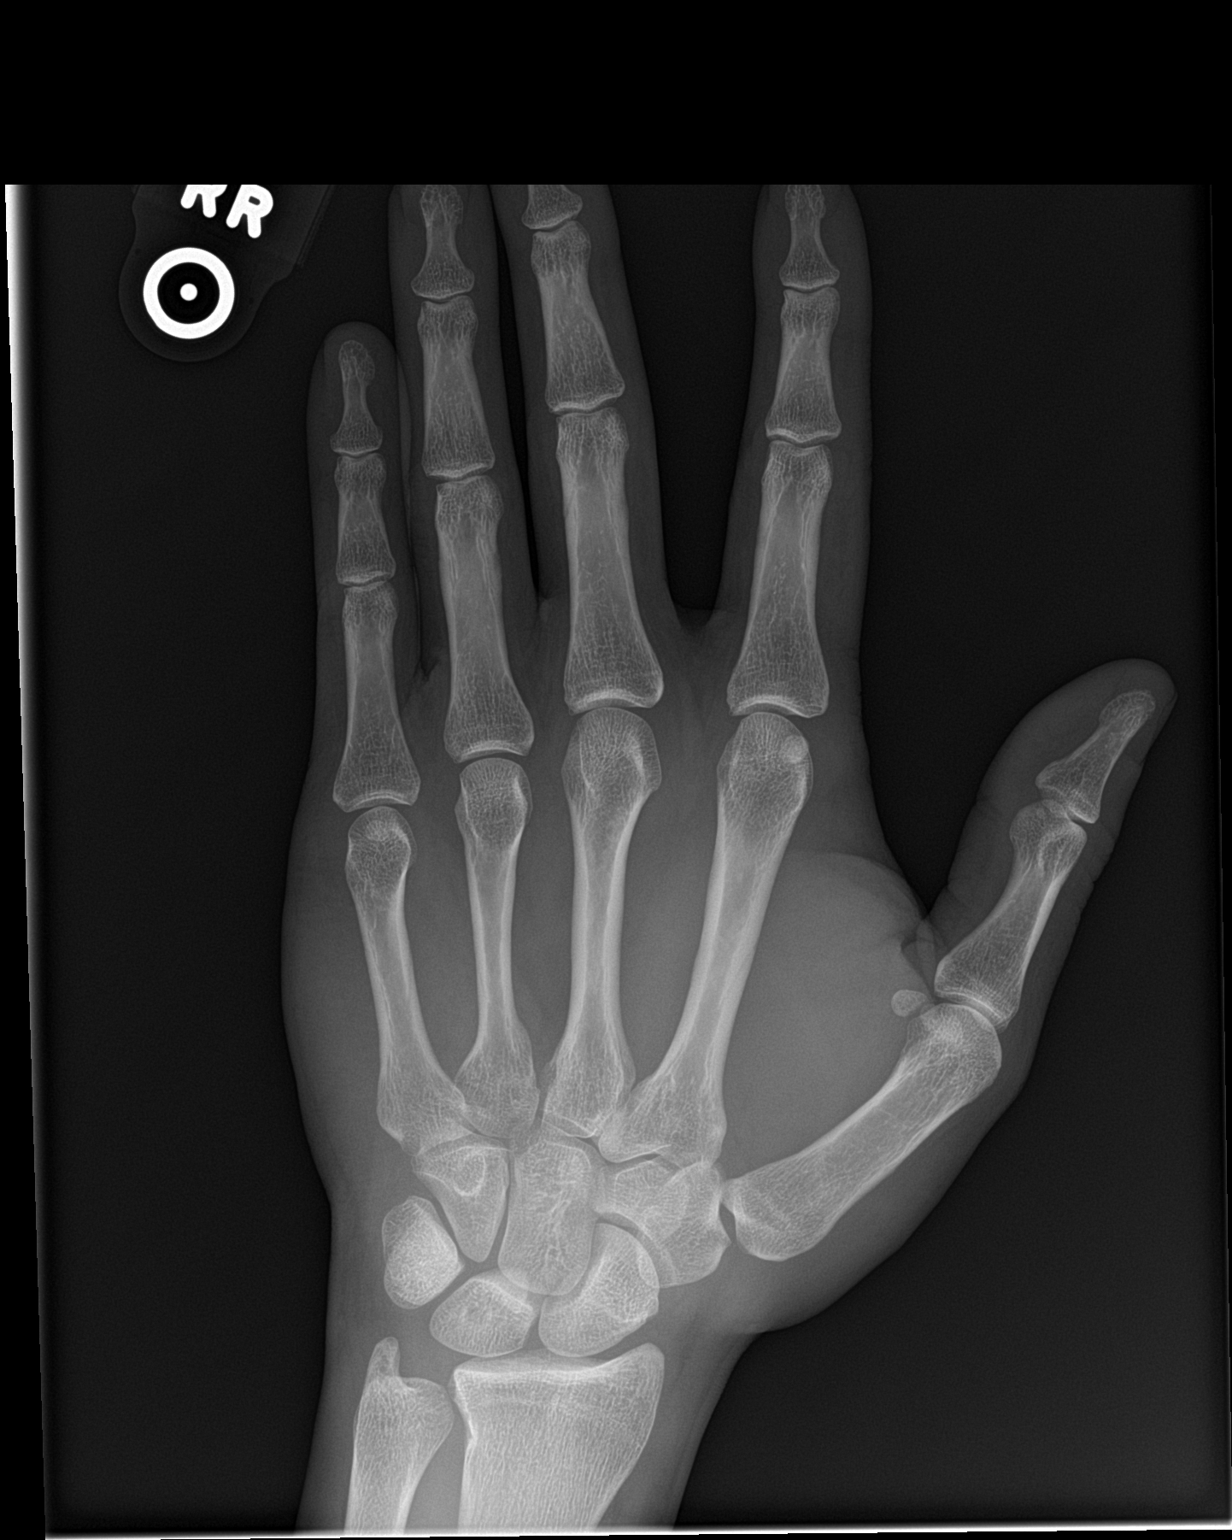

[hand obl]
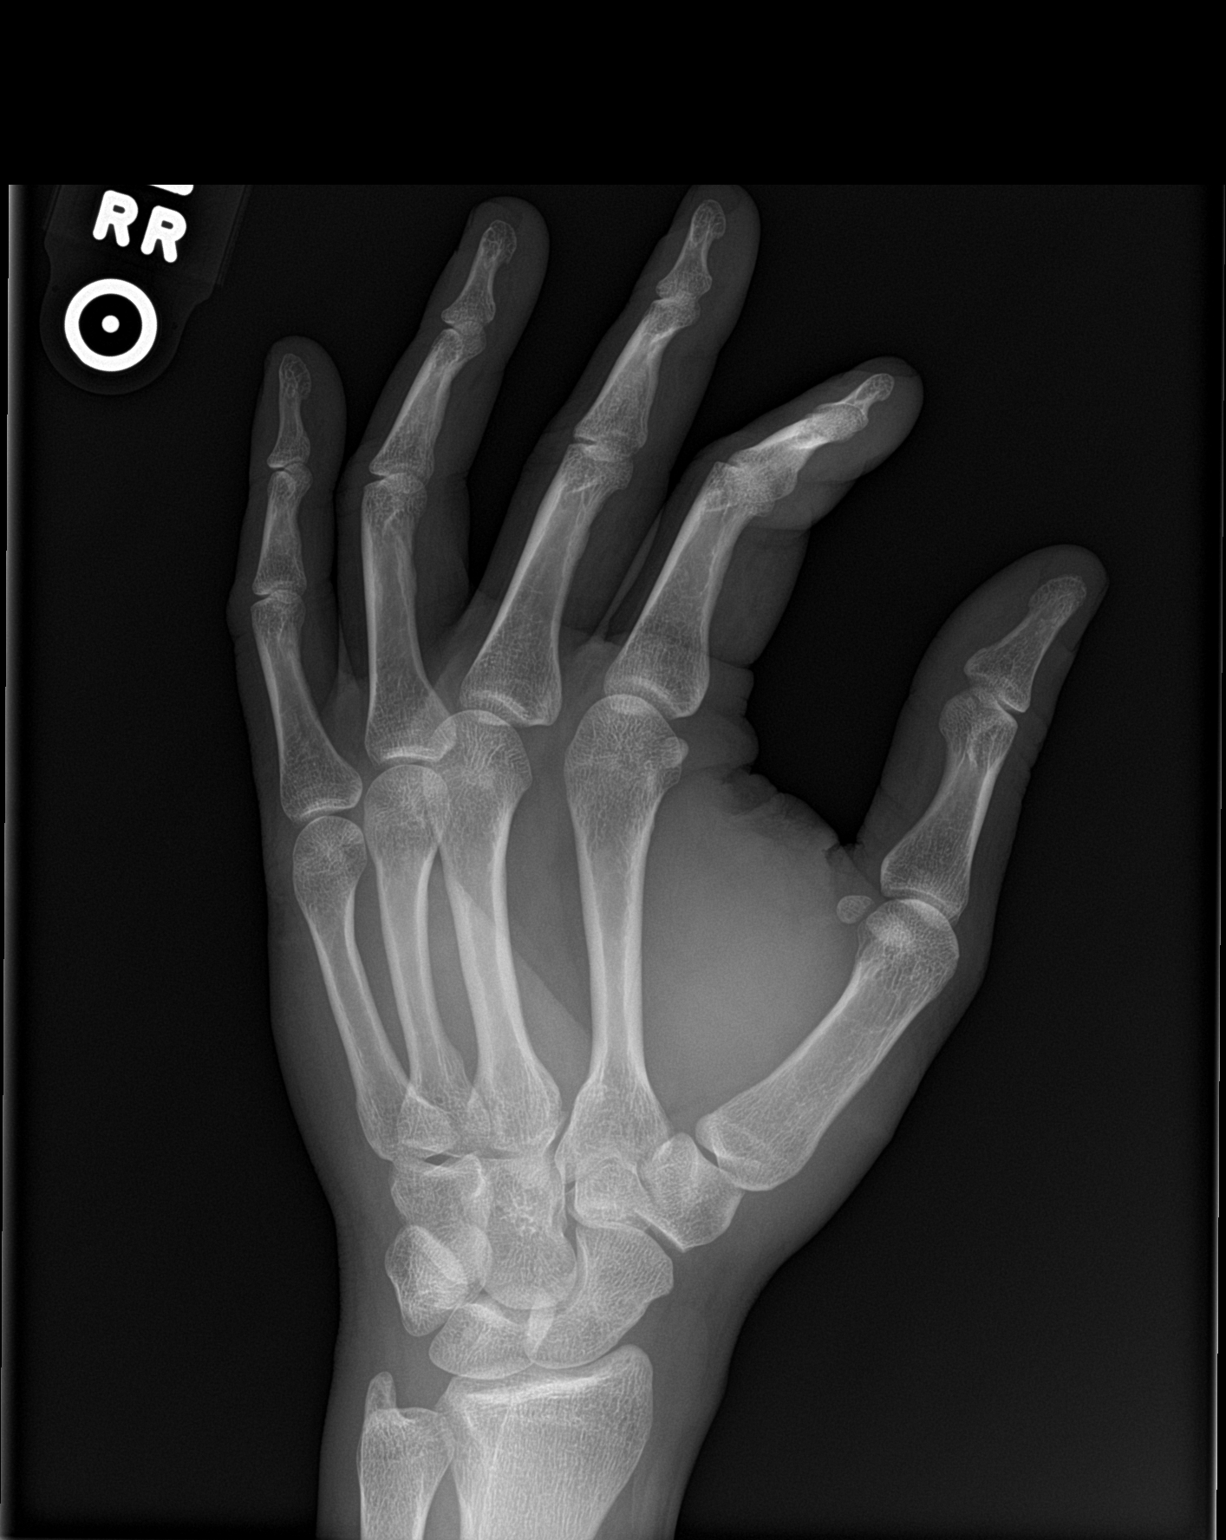

[hand lat]
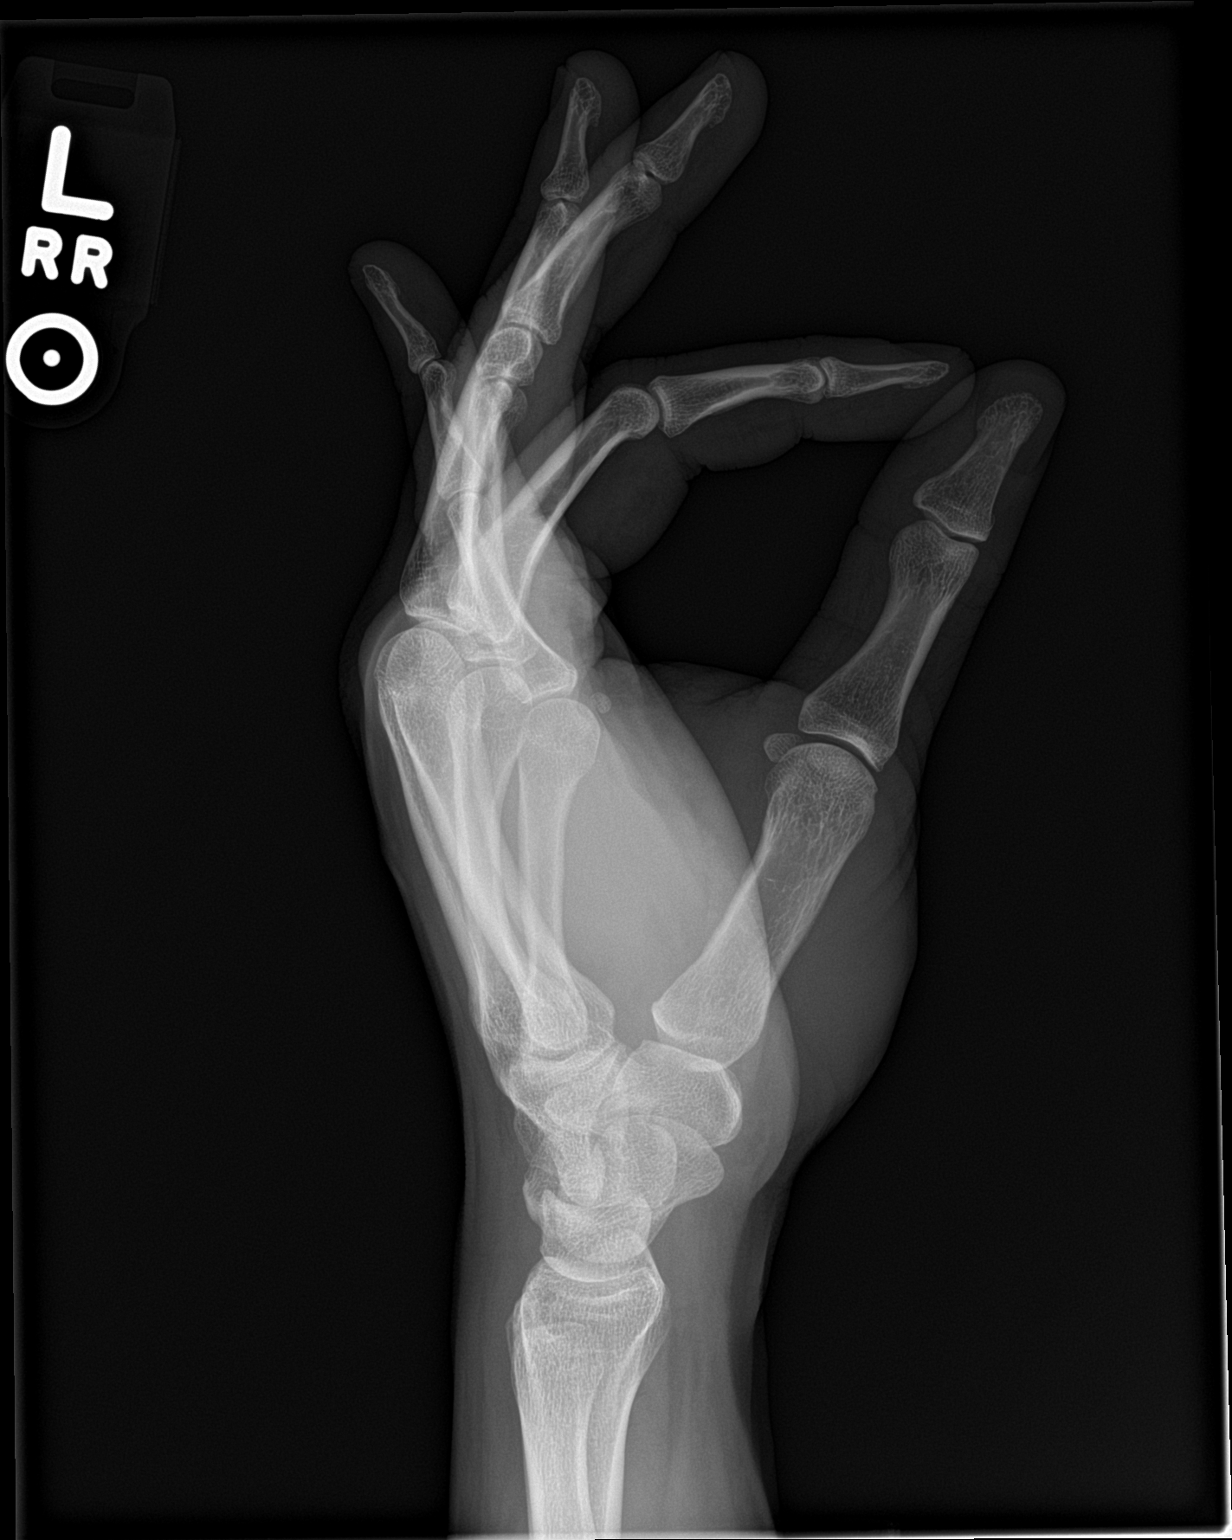

[3 of 3 positions shown; findings below may reference images not displayed]

FINDINGS: Mild diffuse proximal soft tissue swelling in the proximal left
index finger. The bones have normal appearances.
IMPRESSION: Proximal left index finger soft tissue swelling without underlying
bony abnormality.

## 2021-04-17 ENCOUNTER — Encounter: Payer: BC Managed Care – PPO | Admitting: Family Medicine

## 2021-04-20 NOTE — Progress Notes (Deleted)
[place] at [facility] [address], [place], [place] [address] [phone]  Date:  04/22/2021   Name:  Tyler Chase   DOB:  [birth year]   MRN:  [id number]  PCP:  Darreld Mclean, MD    Chief Complaint: No chief complaint on file.   History of Present Illness:  Tyler Chase is a 35 y.o. very pleasant male patient who presents with the following:  Patient here today for physical exam Last seen by myself in March 2020  History of hyperlipidemia, SVT, basal thalassemia minor, depression anxiety His partner is HIV positive-patient takes Truvada for HIV prophylaxis  COVID-19 booster  Patient Active Problem List   Diagnosis Date Noted  . Mixed hyperlipidemia 09/11/2017  . SVT (supraventricular tachycardia) ([place]) 09/11/2017  . Beta thalassemia minor 09/11/2017  . Hepatitis B immune 04/24/2016  . Benign essential tremor 05/18/2015  . Allergic rhinitis 05/09/2015  . Migraine with aura and without status migrainosus, not intractable 08/17/2014  . Tinea versicolor 03/10/2014  . Internal hemorrhoids with other complication 34/74/2595  . Anal fissure 11/02/2013  . Insomnia 10/14/2013  . Generalized anxiety disorder 10/14/2013  . GERD (gastroesophageal reflux disease) 10/14/2013  . BRBPR (bright red blood per rectum) 10/14/2013  . Elimination disorder with fecal symptoms 10/14/2013    Past Medical History:  Diagnosis Date  . Anemia   . Anxiety   . Depression   . Generalized headaches   . GERD (gastroesophageal reflux disease)   . Tendonitis     No past surgical history on file.  Social History   Tobacco Use  . Smoking status: Never Smoker  . Smokeless tobacco: Never Used  Substance Use Topics  . Alcohol use: Yes    Comment: social  . Drug use: No    Family History  Problem Relation Age of Onset  . Arthritis Mother   . Migraines Mother   . Lymphoma Father   . Mental illness Maternal Uncle        great  uncle  . Heart disease Maternal Grandfather   . Hypertension Maternal Grandfather     Allergies  Allergen Reactions  . Food     "nitrate meats"= gives migraines  . Ilosone [Erythromycin]     "childhood reaction"  . Niacin And Related Hives    Medication list has been reviewed and updated.  Current Outpatient Medications on File Prior to Visit  Medication Sig Dispense Refill  . amoxicillin (AMOXIL) 875 MG tablet Take 1 tablet (875 mg total) by mouth 2 (two) times daily. 14 tablet 0  . Amphet-Dextroamphet 3-Bead ER 37.5 MG CP24 Take 1 capsule by mouth daily.     Marland Kitchen FLUoxetine (PROZAC) 20 MG capsule Take 60 mg by mouth at bedtime.     . fluticasone (FLONASE) 50 MCG/ACT nasal spray Place 2 sprays into both nostrils daily. 16 g 1  . propranolol (INDERAL) 10 MG tablet Take 1 tablet (10 mg total) by mouth 4 (four) times daily as needed. 60 tablet 11  . TRUVADA 200-300 MG tablet TAKE 1 TABLET BY MOUTH DAILY. 90 tablet 1   No current facility-administered medications on file prior to visit.    Review of Systems:  As per HPI- otherwise negative.   Physical Examination: There were no vitals filed for this visit. There were no vitals filed for this visit. There is no height or weight on file to calculate BMI. Ideal Body Weight:    GEN: no acute  distress. HEENT: Atraumatic, Normocephalic.  Ears and Nose: No external deformity. CV: RRR, No M/G/R. No JVD. No thrill. No extra heart sounds. PULM: CTA B, no wheezes, crackles, rhonchi. No retractions. No resp. distress. No accessory muscle use. ABD: S, NT, ND, +BS. No rebound. No HSM. EXTR: No c/c/e PSYCH: Normally interactive. Conversant.    Assessment and Plan: *** This visit occurred during the SARS-CoV-2 public health emergency.  Safety protocols were in place, including screening questions prior to the visit, additional usage of staff PPE, and extensive cleaning of exam room while observing appropriate contact time as indicated  for disinfecting solutions.    Signed Lamar Blinks, MD

## 2021-04-22 ENCOUNTER — Encounter: Payer: BC Managed Care – PPO | Admitting: Family Medicine

## 2021-05-05 NOTE — Progress Notes (Addendum)
[place] at [facility] [place], [place], [place] [address] [phone]  Date:  05/08/2021   Name:  Tyler Chase   DOB:  01-Nov-1986   MRN:  [id number]  PCP:  Darreld Mclean, MD    Chief Complaint: Annual Exam and Rash (Abdominal rash and back, one month, no intching)   History of Present Illness:  Tyler Chase is a 35 y.o. very pleasant male patient who presents with the following:  Here today for a CPE- history of hyperlipidemia, SVT, B thal Last seen by myself about 2 years ago Also seen by Percell Miller in 2020 for a sick visit  He is generally in good health  He is generally doing well, no major changes   Most recent labs 2020- can update today  He is not fasting today   covid booster- done, updated today   ?still taking truvada - he is no longer taking  He would like STI testing today  He is getting his stimulant from Dr Rosana Hoes  He is not getting a lot of exercise- hard to fit it in He has gained some weight during the pandemic  He is working some from home and some in the office- he is at [employer]  BP Readings from Last 3 Encounters:  05/08/21 122/88  02/17/19 126/78  12/27/18 122/80   Wt Readings from Last 3 Encounters:  05/08/21 218 lb (98.9 kg)  10/10/19 199 lb (90.3 kg)  02/17/19 201 lb (91.2 kg)     Patient Active Problem List   Diagnosis Date Noted  . Mixed hyperlipidemia 09/11/2017  . SVT (supraventricular tachycardia) ([place]) 09/11/2017  . Beta thalassemia minor 09/11/2017  . Hepatitis B immune 04/24/2016  . Benign essential tremor 05/18/2015  . Allergic rhinitis 05/09/2015  . Migraine with aura and without status migrainosus, not intractable 08/17/2014  . Tinea versicolor 03/10/2014  . Internal hemorrhoids with other complication 02/58/5277  . Anal fissure 11/02/2013  . Insomnia 10/14/2013  . Generalized anxiety disorder 10/14/2013  . GERD (gastroesophageal reflux disease) 10/14/2013  .  BRBPR (bright red blood per rectum) 10/14/2013  . Elimination disorder with fecal symptoms 10/14/2013    Past Medical History:  Diagnosis Date  . Anemia   . Anxiety   . Depression   . Generalized headaches   . GERD (gastroesophageal reflux disease)   . Tendonitis     No past surgical history on file.  Social History   Tobacco Use  . Smoking status: Never Smoker  . Smokeless tobacco: Never Used  Substance Use Topics  . Alcohol use: Yes    Comment: social  . Drug use: No    Family History  Problem Relation Age of Onset  . Arthritis Mother   . Migraines Mother   . Lymphoma Father   . Mental illness Maternal Uncle        great uncle  . Heart disease Maternal Grandfather   . Hypertension Maternal Grandfather     Allergies  Allergen Reactions  . Food     "nitrate meats"= gives migraines  . Ilosone [Erythromycin]     "childhood reaction"  . Niacin And Related Hives    Medication list has been reviewed and updated.  Current Outpatient Medications on File Prior to Visit  Medication Sig Dispense Refill  . Amphet-Dextroamphet 3-Bead ER 37.5 MG CP24 Take 1 capsule by mouth daily.     Marland Kitchen FLUoxetine (PROZAC) 20 MG capsule Take 60 mg by  mouth at bedtime.      No current facility-administered medications on file prior to visit.    Review of Systems:  As per HPI- otherwise negative.   Physical Examination: Vitals:   05/08/21 0853 05/08/21 0912  BP: (!) 142/98 122/88  Pulse: (!) 105 96  Resp: 17   Temp: (!) 97.5 F (36.4 C)   SpO2: 99%    Vitals:   05/08/21 0853  Weight: 218 lb (98.9 kg)  Height: 6' 0.75" (1.848 m)   Body mass index is 28.96 kg/m. Ideal Body Weight: Weight in (lb) to have BMI = 25: 187.8  GEN: no acute distress. Overweight, looks well  HEENT: Atraumatic, Normocephalic.   Bilateral TM wnl, oropharynx normal.  PEERL,EOMI.   Ears and Nose: No external deformity. CV: RRR, No M/G/R. No JVD. No thrill. No extra heart sounds. PULM: CTA B, no  wheezes, crackles, rhonchi. No retractions. No resp. distress. No accessory muscle use. ABD: S, NT, ND, +BS. No rebound. No HSM. EXTR: No c/c/e PSYCH: Normally interactive. Conversant.  He has likely PR rash on his trunk bilaterally, and upper arms and upper thighs-   He took his stimulant at 0600 this am - likely why his pulse is up slighlty  Assessment and Plan: Physical exam  Screening for deficiency anemia - Plan: CBC  Screening for diabetes mellitus - Plan: Comprehensive metabolic panel, Hemoglobin A1c  Screening for hyperlipidemia - Plan: Lipid panel  Routine screening for STI (sexually transmitted infection) - Plan: HIV Antibody (routine testing w rflx), RPR, Hepatitis C antibody, Urine cytology ancillary only([place])  Pityriasis rosea     CPE today Encouraged healthy diet and exercise routine Will plan further follow- up pending labs. Encouraged him to work on weight loss over the summer and he plans to do so Discussed rash- I think this is PR, we will check STI screening as well to ensure not RPR or HIV He will let me know if not improving soon We did urine test for GC/CMZ- pt advised this may not be as accurate as direct swab   This visit occurred during the SARS-CoV-2 public health emergency.  Safety protocols were in place, including screening questions prior to the visit, additional usage of staff PPE, and extensive cleaning of exam room while observing appropriate contact time as indicated for disinfecting solutions.    Signed Lamar Blinks, MD  Received his labs as below, message to pt  Results for orders placed or performed in visit on 05/08/21  CBC  Result Value Ref Range   WBC 5.5 4.0 - 10.5 K/uL   RBC 6.35 (H) 4.22 - 5.81 Mil/uL   Platelets 294.0 150.0 - 400.0 K/uL   Hemoglobin 14.0 13.0 - 17.0 g/dL   HCT 43.4 39.0 - 52.0 %   MCV 68.4 Repeated and verified X2. (L) 78.0 - 100.0 fl   MCHC 32.3 30.0 - 36.0 g/dL   RDW 15.7 (H) 11.5 - 15.5 %   Comprehensive metabolic panel  Result Value Ref Range   Sodium 139 135 - 145 mEq/L   Potassium 4.2 3.5 - 5.1 mEq/L   Chloride 101 96 - 112 mEq/L   CO2 31 19 - 32 mEq/L   Glucose, Bld 88 70 - 99 mg/dL   BUN 17 6 - 23 mg/dL   Creatinine, Ser 1.04 0.40 - 1.50 mg/dL   Total Bilirubin 1.0 0.2 - 1.2 mg/dL   Alkaline Phosphatase 57 39 - 117 U/L   AST 22 0 - 37 U/L  ALT 33 0 - 53 U/L   Total Protein 7.1 6.0 - 8.3 g/dL   Albumin 4.5 3.5 - 5.2 g/dL   GFR 92.60 >60.00 mL/min   Calcium 9.7 8.4 - 10.5 mg/dL  Hemoglobin A1c  Result Value Ref Range   Hgb A1c MFr Bld 5.5 4.6 - 6.5 %  Lipid panel  Result Value Ref Range   Cholesterol 177 0 - 200 mg/dL   Triglycerides 202.0 (H) 0.0 - 149.0 mg/dL   HDL 32.00 (L) >39.00 mg/dL   VLDL 40.4 (H) 0.0 - 40.0 mg/dL   Total CHOL/HDL Ratio 6    NonHDL 145.22   LDL cholesterol, direct  Result Value Ref Range   Direct LDL 127.0 mg/dL

## 2021-05-05 NOTE — Patient Instructions (Addendum)
Good to see you again today!  I will be in touch with your labs asap Do work on exercise and a healthy diet over the summer- would like to see you get back down towards 200 lbs if you can, gradually!    I think you have pityriasis rosea rash on your trunk - this is a harmless rash that should clear up soon, but please let me know if not resolved within another 3-4 weeks Ok to use benadryl as needed for itching    Health Maintenance, Male Adopting a healthy lifestyle and getting preventive care are important in promoting health and wellness. Ask your health care provider about:  The right schedule for you to have regular tests and exams.  Things you can do on your own to prevent diseases and keep yourself healthy. What should I know about diet, weight, and exercise? Eat a healthy diet  Eat a diet that includes plenty of vegetables, fruits, low-fat dairy products, and lean protein.  Do not eat a lot of foods that are high in solid fats, added sugars, or sodium.   Maintain a healthy weight Body mass index (BMI) is a measurement that can be used to identify possible weight problems. It estimates body fat based on height and weight. Your health care provider can help determine your BMI and help you achieve or maintain a healthy weight. Get regular exercise Get regular exercise. This is one of the most important things you can do for your health. Most adults should:  Exercise for at least 150 minutes each week. The exercise should increase your heart rate and make you sweat (moderate-intensity exercise).  Do strengthening exercises at least twice a week. This is in addition to the moderate-intensity exercise.  Spend less time sitting. Even light physical activity can be beneficial. Watch cholesterol and blood lipids Have your blood tested for lipids and cholesterol at 36 years of age, then have this test every 5 years. You may need to have your cholesterol levels checked more often  if:  Your lipid or cholesterol levels are high.  You are older than 36 years of age.  You are at high risk for heart disease. What should I know about cancer screening? Many types of cancers can be detected early and may often be prevented. Depending on your health history and family history, you may need to have cancer screening at various ages. This may include screening for:  Colorectal cancer.  Prostate cancer.  Skin cancer.  Lung cancer. What should I know about heart disease, diabetes, and high blood pressure? Blood pressure and heart disease  High blood pressure causes heart disease and increases the risk of stroke. This is more likely to develop in people who have high blood pressure readings, are of African descent, or are overweight.  Talk with your health care provider about your target blood pressure readings.  Have your blood pressure checked: ? Every 3-5 years if you are 44-70 years of age. ? Every year if you are 52 years old or older.  If you are between the ages of 45 and 20 and are a current or former smoker, ask your health care provider if you should have a one-time screening for abdominal aortic aneurysm (AAA). Diabetes Have regular diabetes screenings. This checks your fasting blood sugar level. Have the screening done:  Once every three years after age 34 if you are at a normal weight and have a low risk for diabetes.  More often and at a  younger age if you are overweight or have a high risk for diabetes. What should I know about preventing infection? Hepatitis B If you have a higher risk for hepatitis B, you should be screened for this virus. Talk with your health care provider to find out if you are at risk for hepatitis B infection. Hepatitis C Blood testing is recommended for:  Everyone born from [birth year] through 1966.  Anyone with known risk factors for hepatitis C. Sexually transmitted infections (STIs)  You should be screened each year for STIs,  including gonorrhea and chlamydia, if: ? You are sexually active and are younger than 35 years of age. ? You are older than 36 years of age and your health care provider tells you that you are at risk for this type of infection. ? Your sexual activity has changed since you were last screened, and you are at increased risk for chlamydia or gonorrhea. Ask your health care provider if you are at risk.  Ask your health care provider about whether you are at high risk for HIV. Your health care provider may recommend a prescription medicine to help prevent HIV infection. If you choose to take medicine to prevent HIV, you should first get tested for HIV. You should then be tested every 3 months for as long as you are taking the medicine. Follow these instructions at home: Lifestyle  Do not use any products that contain nicotine or tobacco, such as cigarettes, e-cigarettes, and chewing tobacco. If you need help quitting, ask your health care provider.  Do not use street drugs.  Do not share needles.  Ask your health care provider for help if you need support or information about quitting drugs. Alcohol use  Do not drink alcohol if your health care provider tells you not to drink.  If you drink alcohol: ? Limit how much you have to 0-2 drinks a day. ? Be aware of how much alcohol is in your drink. In the [place] one drink equals one 12 oz bottle of beer (355 mL), one 5 oz glass of wine (148 mL), or one 1 oz glass of hard liquor (44 mL). General instructions  Schedule regular health, dental, and eye exams.  Stay current with your vaccines.  Tell your health care provider if: ? You often feel depressed. ? You have ever been abused or do not feel safe at home. Summary  Adopting a healthy lifestyle and getting preventive care are important in promoting health and wellness.  Follow your health care provider's instructions about healthy diet, exercising, and getting tested or screened for  diseases.  Follow your health care provider's instructions on monitoring your cholesterol and blood pressure. This information is not intended to replace advice given to you by your health care provider. Make sure you discuss any questions you have with your health care provider. Document Revised: 11/24/2018 Document Reviewed: 11/24/2018 Elsevier Patient Education  2021 [employer].

## 2021-05-08 ENCOUNTER — Other Ambulatory Visit (HOSPITAL_COMMUNITY)
Admission: RE | Admit: 2021-05-08 | Discharge: 2021-05-08 | Disposition: A | Discharge status: Home / Self Care | Payer: BC Managed Care – PPO | Source: Ambulatory Visit | Attending: Family Medicine | Admitting: Family Medicine

## 2021-05-08 ENCOUNTER — Encounter: Payer: Self-pay | Admitting: Family Medicine

## 2021-05-08 ENCOUNTER — Other Ambulatory Visit: Payer: Self-pay

## 2021-05-08 ENCOUNTER — Ambulatory Visit (INDEPENDENT_AMBULATORY_CARE_PROVIDER_SITE_OTHER): Payer: BC Managed Care – PPO | Admitting: Family Medicine

## 2021-05-08 VITALS — BP 122/88 | HR 96 | Temp 97.5°F | Resp 17 | Ht 72.75 in | Wt 218.0 lb

## 2021-05-08 DIAGNOSIS — Z130 Encounter for screening for diseases of the blood and blood-forming organs and certain disorders involving the immune mechanism: Secondary | ICD-10-CM | POA: Diagnosis not present

## 2021-05-08 DIAGNOSIS — D2230 Melanocytic nevi of unspecified part of face: Secondary | ICD-10-CM

## 2021-05-08 DIAGNOSIS — L42 Pityriasis rosea: Secondary | ICD-10-CM

## 2021-05-08 DIAGNOSIS — Z0000 Encounter for general adult medical examination without abnormal findings: Secondary | ICD-10-CM

## 2021-05-08 DIAGNOSIS — Z113 Encounter for screening for infections with a predominantly sexual mode of transmission: Secondary | ICD-10-CM | POA: Insufficient documentation

## 2021-05-08 DIAGNOSIS — Z13220 Encounter for screening for lipoid disorders: Secondary | ICD-10-CM

## 2021-05-08 DIAGNOSIS — Z131 Encounter for screening for diabetes mellitus: Secondary | ICD-10-CM

## 2021-05-08 LAB — COMPREHENSIVE METABOLIC PANEL
ALT: 33 U/L (ref 0–53)
AST: 22 U/L (ref 0–37)
Albumin: 4.5 g/dL (ref 3.5–5.2)
Alkaline Phosphatase: 57 U/L (ref 39–117)
BUN: 17 mg/dL (ref 6–23)
CO2: 31 mEq/L (ref 19–32)
Calcium: 9.7 mg/dL (ref 8.4–10.5)
Chloride: 101 mEq/L (ref 96–112)
Creatinine, Ser: 1.04 mg/dL (ref 0.40–1.50)
GFR: 92.6 mL/min (ref >60.00)
Glucose, Bld: 88 mg/dL (ref 70–99)
Potassium: 4.2 mEq/L (ref 3.5–5.1)
Sodium: 139 mEq/L (ref 135–145)
Total Bilirubin: 1 mg/dL (ref 0.2–1.2)
Total Protein: 7.1 g/dL (ref 6.0–8.3)

## 2021-05-08 LAB — LIPID PANEL
Cholesterol: 177 mg/dL (ref 0–200)
HDL: 32 mg/dL — ABNORMAL LOW (ref >39.00)
NonHDL: 145.22
Total CHOL/HDL Ratio: 6
Triglycerides: 202 mg/dL — ABNORMAL HIGH (ref 0.0–149.0)
VLDL: 40.4 mg/dL — ABNORMAL HIGH (ref 0.0–40.0)

## 2021-05-08 LAB — CBC
HCT: 43.4 % (ref 39.0–52.0)
Hemoglobin: 14 g/dL (ref 13.0–17.0)
MCHC: 32.3 g/dL (ref 30.0–36.0)
MCV: 68.4 fl — ABNORMAL LOW (ref 78.0–100.0)
Platelets: 294 10*3/uL (ref 150.0–400.0)
RBC: 6.35 Mil/uL — ABNORMAL HIGH (ref 4.22–5.81)
RDW: 15.7 % — ABNORMAL HIGH (ref 11.5–15.5)
WBC: 5.5 10*3/uL (ref 4.0–10.5)

## 2021-05-08 LAB — HEMOGLOBIN A1C: Hgb A1c MFr Bld: 5.5 % (ref 4.6–6.5)

## 2021-05-08 LAB — LDL CHOLESTEROL, DIRECT: Direct LDL: 127 mg/dL

## 2021-05-09 LAB — HEPATITIS C ANTIBODY
Hepatitis C Ab: NONREACTIVE
SIGNAL TO CUT-OFF: 0.01 (ref <1.00)

## 2021-05-09 LAB — RPR: RPR Ser Ql: NONREACTIVE

## 2021-05-09 LAB — HIV ANTIBODY (ROUTINE TESTING W REFLEX): HIV 1&2 Ab, 4th Generation: NONREACTIVE

## 2021-05-10 LAB — URINE CYTOLOGY ANCILLARY ONLY
Chlamydia: NEGATIVE
Comment: NEGATIVE
Comment: NORMAL
Neisseria Gonorrhea: NEGATIVE

## 2021-05-10 NOTE — Telephone Encounter (Signed)
Do you want a visit to see him or would you like a referral to derm?

## 2021-05-16 ENCOUNTER — Ambulatory Visit: Payer: BC Managed Care – PPO | Admitting: Family Medicine

## 2022-07-03 NOTE — Progress Notes (Deleted)
[place] at [facility] [address], [place], [place] [address] [phone]  Date:  07/09/2022   Name:  Tyler Chase   DOB:  Dec 05, 1986   MRN:  [id number]  PCP:  Darreld Mclean, MD    Chief Complaint: No chief complaint on file.   History of Present Illness:  Tyler Chase is a 36 y.o. very pleasant male patient who presents with the following:  Patient seen today for physical exam Most recent visit with myself May 2022 Generally in good health, history of hyperlipidemia, SVT, beta thalassemia, ADD treated by different provider Can update lab work today  At our last visit he was hoping to get more exercise.  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 Patient Active Problem List   Diagnosis Date Noted   Mixed hyperlipidemia 09/11/2017   SVT (supraventricular tachycardia) ([place]) 09/11/2017   Beta thalassemia minor 09/11/2017   Hepatitis B immune 04/24/2016   Benign essential tremor 05/18/2015   Allergic rhinitis 05/09/2015   Migraine with aura and without status migrainosus, not intractable 08/17/2014   Tinea versicolor 03/10/2014   Internal hemorrhoids with other complication 74/07/1447   Anal fissure 11/02/2013   Insomnia 10/14/2013   Generalized anxiety disorder 10/14/2013   GERD (gastroesophageal reflux disease) 10/14/2013   BRBPR (bright red blood per rectum) 10/14/2013   Elimination disorder with fecal symptoms 10/14/2013    Past Medical History:  Diagnosis Date   Anemia    Anxiety    Depression    Generalized headaches    GERD (gastroesophageal reflux disease)    Tendonitis     No past surgical history on file.  Social History   Tobacco Use   Smoking status: Never   Smokeless tobacco: Never  Substance Use Topics   Alcohol use: Yes    Comment: social   Drug use: No    Family History  Problem Relation Age of Onset   Arthritis Mother    Migraines Mother    Lymphoma Father    Mental  illness Maternal Uncle        great uncle   Heart disease Maternal Grandfather    Hypertension Maternal Grandfather     Allergies  Allergen Reactions   Food     "nitrate meats"= gives migraines   Ilosone [Erythromycin]     "childhood reaction"   Niacin And Related Hives    Medication list has been reviewed and updated.  Current Outpatient Medications on File Prior to Visit  Medication Sig Dispense Refill   Amphet-Dextroamphet 3-Bead ER 37.5 MG CP24 Take 1 capsule by mouth daily.      FLUoxetine (PROZAC) 20 MG capsule Take 60 mg by mouth at bedtime.      No current facility-administered medications on file prior to visit.    Review of Systems:  As per HPI- otherwise negative.   Physical Examination: There were no vitals filed for this visit. There were no vitals filed for this visit. There is no height or weight on file to calculate BMI. Ideal Body Weight:    GEN: no acute distress. HEENT: Atraumatic, Normocephalic.  Ears and Nose: No external deformity. CV: RRR, No M/G/R. No JVD. No thrill. No extra heart sounds. PULM: CTA B, no wheezes, crackles, rhonchi. No retractions. No resp. distress. No accessory muscle use. ABD: S, NT, ND, +BS. No rebound. No HSM. EXTR: No c/c/e PSYCH: Normally interactive. Conversant.    Assessment and Plan: *** Physical exam today.  Encouraged healthy diet and exercise routine Signed Lamar Blinks, MD

## 2022-07-09 ENCOUNTER — Encounter: Payer: BC Managed Care – PPO | Admitting: Family Medicine

## 2022-07-09 DIAGNOSIS — Z131 Encounter for screening for diabetes mellitus: Secondary | ICD-10-CM

## 2022-07-09 DIAGNOSIS — Z13220 Encounter for screening for lipoid disorders: Secondary | ICD-10-CM

## 2022-07-09 DIAGNOSIS — Z0000 Encounter for general adult medical examination without abnormal findings: Secondary | ICD-10-CM

## 2022-07-09 DIAGNOSIS — Z130 Encounter for screening for diseases of the blood and blood-forming organs and certain disorders involving the immune mechanism: Secondary | ICD-10-CM

## 2022-07-09 DIAGNOSIS — Z113 Encounter for screening for infections with a predominantly sexual mode of transmission: Secondary | ICD-10-CM

## 2023-04-30 NOTE — Patient Instructions (Signed)
It was great to see you again today, I will be in touch with your labs ASAP Referral to ENT pending Work on a gradual exercise program. If you have chest pain or note your breathing does not get easier with increased exercise please alert me.  If you lipids look unfavorable I will also recommend getting the CT coronary scan for you  Take care, enjoy your summer!

## 2023-04-30 NOTE — Progress Notes (Signed)
[facility] [address], [address] [place], [place] [address] [phone]  Date:  05/04/2023   Name:  Tyler Chase   DOB:  01-19-1986   MRN:  [id number]  PCP:  Pearline Cables, MD    Chief Complaint: Annual Exam (Concerns/ questions: R ear fullness/popping/cracking, pain x over 1 year)   History of Present Illness:  Tyler Chase is a 37 y.o. 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  He plans to relax   Generally in good health but he has gained some weight, not getting much exercise He has used Truvada intermittently for PrEP in the past He takes Adderall ER 37.5 daily, prescribed by Dr. Earlene Plater Can update labs today  STI, HIV screening?- declines   He has noted an issue with his right ear for about a year- he got blasted with a sudden loud sound and did not hear well for a couple of days.  A month or so later he noted a crackling and feeling of fullness in his ear.  Not generally painful [facility] which did not really help He may get some ringing in his ears if he is exposed to a loud noise.  He thinks the hearing is still not quite right   No history of Prostate cancer in the family, no FHX of early cardiac disease  He does note that he gets out of breath with exercise, no CP.  He suspect this is due to deconditioning   Wt Readings from Last 3 Encounters:  05/04/23 234 lb (106.1 kg)  05/08/21 218 lb (98.9 kg)  10/10/19 199 lb (90.3 kg)     Patient Active Problem List   Diagnosis Date Noted   Mixed hyperlipidemia 09/11/2017   SVT (supraventricular tachycardia) 09/11/2017   Beta thalassemia minor 09/11/2017   Hepatitis B immune  04/24/2016   Benign essential tremor 05/18/2015   Allergic rhinitis 05/09/2015   Migraine with aura and without status migrainosus, not intractable 08/17/2014   Tinea versicolor 03/10/2014   Internal hemorrhoids with other complication 11/02/2013   Anal fissure 11/02/2013   Insomnia 10/14/2013   Generalized anxiety disorder 10/14/2013   GERD (gastroesophageal reflux disease) 10/14/2013   BRBPR (bright red blood per rectum) 10/14/2013   Elimination disorder with fecal symptoms 10/14/2013    Past Medical History:  Diagnosis Date   Anemia    Anxiety    Depression    Generalized headaches    GERD (gastroesophageal reflux disease)    Tendonitis     No past surgical history on file.  Social History   Tobacco Use   Smoking status: Never   Smokeless tobacco: Never  Substance Use Topics   Alcohol use: Yes    Comment: social   Drug use: No    Family History  Problem Relation Age of Onset   Arthritis Mother    Migraines Mother    Lymphoma Father    Mental illness Maternal Uncle        great uncle   Heart disease Maternal Grandfather    Hypertension Maternal Grandfather     Allergies  Allergen Reactions   Food     "  nitrate meats"= gives migraines   Ilosone [Erythromycin]     "childhood reaction"   Niacin And Related Hives    Medication list has been reviewed and updated.  Current Outpatient Medications on File Prior to Visit  Medication Sig Dispense Refill   Amphet-Dextroamphet 3-Bead ER 37.5 MG CP24 Take 1 capsule by mouth daily.      FLUoxetine (PROZAC) 20 MG capsule Take 60 mg by mouth at bedtime.      No current facility-administered medications on file prior to visit.    Review of Systems:  As per HPI- otherwise negative.   Physical Examination: Vitals:   05/04/23 0918  BP: 110/62  Pulse: 94  Resp: 18  Temp: 98.3 F (36.8 C)  SpO2: 98%   Vitals:   05/04/23 0918  Weight: 234 lb (106.1 kg)  Height: 6' 0.75" (1.848 m)   Body mass index is  31.09 kg/m. Ideal Body Weight: Weight in (lb) to have BMI = 25: 187.8  GEN: no acute distress.  Obese, looks well  HEENT: Atraumatic, Normocephalic.  Bilateral TM wnl, oropharynx normal.  PEERL,EOMI.   B ears normal on exam Ears and Nose: No external deformity. CV: RRR, No M/G/R. No JVD. No thrill. No extra heart sounds. PULM: CTA B, no wheezes, crackles, rhonchi. No retractions. No resp. distress. No accessory muscle use. ABD: S, NT, ND. No rebound. No HSM. EXTR: No c/c/e PSYCH: Normally interactive. Conversant.    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  Encouraged healthy diet and exercise routine; build up gradually and let me know if breathing does not become easier as he gets in shape  Will plan further follow- up pending labs. Might consider a coronary calcium if lipids are still high - he is willing to do so  Referral to ENT for likely noise related hearing loss  Signed Abbe Amsterdam, MD  Received labs as below, message to patient  Results for orders placed or performed in visit on 05/04/23  CBC  Result Value Ref Range   WBC 6.0 4.0 - 10.5 K/uL   RBC 6.43 (H) 4.22 - 5.81 Mil/uL   Platelets 336.0 150.0 - 400.0 K/uL   Hemoglobin 14.2 13.0 - 17.0 g/dL   HCT 16.1 09.6 - 04.5 %   MCV 68.6 (L) 78.0 - 100.0 fl   MCHC 32.1 30.0 - 36.0 g/dL   RDW 40.9 (H) 81.1 - 91.4 %  Comprehensive metabolic panel  Result Value Ref Range   Sodium 136 135 - 145 mEq/L   Potassium 4.4 3.5 - 5.1 mEq/L   Chloride 99 96 - 112 mEq/L   CO2 28 19 - 32 mEq/L   Glucose, Bld 94 70 - 99 mg/dL   BUN 17 6 - 23 mg/dL   Creatinine, Ser 7.82 0.40 - 1.50 mg/dL   Total Bilirubin 1.2 0.2 -  1.2 mg/dL   Alkaline Phosphatase 59 39 - 117 U/L   AST 26 0 - 37 U/L   ALT 50 0 - 53 U/L   Total Protein 7.1 6.0 - 8.3 g/dL   Albumin 4.5 3.5 - 5.2 g/dL   GFR 95.62 >13.08 mL/min   Calcium 9.6 8.4 - 10.5 mg/dL  Hemoglobin M5H  Result  Value Ref Range   Hgb A1c MFr Bld 5.7 4.6 - 6.5 %  Lipid panel  Result Value Ref Range   Cholesterol 209 (H) 0 - 200 mg/dL   Triglycerides 161.0 (H) 0.0 - 149.0 mg/dL   HDL 96.04 >54.09 mg/dL   VLDL 81.1 0.0 - 91.4 mg/dL   LDL Cholesterol 782 (H) 0 - 99 mg/dL   Total CHOL/HDL Ratio 5    NonHDL 167.56

## 2023-05-04 ENCOUNTER — Ambulatory Visit (INDEPENDENT_AMBULATORY_CARE_PROVIDER_SITE_OTHER): Payer: BC Managed Care – PPO | Admitting: Family Medicine

## 2023-05-04 ENCOUNTER — Encounter: Payer: Self-pay | Admitting: Family Medicine

## 2023-05-04 VITALS — BP 110/62 | HR 94 | Temp 98.3°F | Resp 18 | Ht 72.75 in | Wt 234.0 lb

## 2023-05-04 DIAGNOSIS — Z130 Encounter for screening for diseases of the blood and blood-forming organs and certain disorders involving the immune mechanism: Secondary | ICD-10-CM

## 2023-05-04 DIAGNOSIS — H833X1 Noise effects on right inner ear: Secondary | ICD-10-CM

## 2023-05-04 DIAGNOSIS — Z131 Encounter for screening for diabetes mellitus: Secondary | ICD-10-CM

## 2023-05-04 DIAGNOSIS — Z13220 Encounter for screening for lipoid disorders: Secondary | ICD-10-CM | POA: Diagnosis not present

## 2023-05-04 DIAGNOSIS — Z0000 Encounter for general adult medical examination without abnormal findings: Secondary | ICD-10-CM

## 2023-05-04 DIAGNOSIS — R7303 Prediabetes: Secondary | ICD-10-CM

## 2023-05-04 LAB — CBC
HCT: 44.1 % (ref 39.0–52.0)
Hemoglobin: 14.2 g/dL (ref 13.0–17.0)
MCHC: 32.1 g/dL (ref 30.0–36.0)
MCV: 68.6 fl — ABNORMAL LOW (ref 78.0–100.0)
Platelets: 336 10*3/uL (ref 150.0–400.0)
RBC: 6.43 Mil/uL — ABNORMAL HIGH (ref 4.22–5.81)
RDW: 15.8 % — ABNORMAL HIGH (ref 11.5–15.5)
WBC: 6 10*3/uL (ref 4.0–10.5)

## 2023-05-04 LAB — COMPREHENSIVE METABOLIC PANEL
ALT: 50 U/L (ref 0–53)
AST: 26 U/L (ref 0–37)
Albumin: 4.5 g/dL (ref 3.5–5.2)
Alkaline Phosphatase: 59 U/L (ref 39–117)
BUN: 17 mg/dL (ref 6–23)
CO2: 28 mEq/L (ref 19–32)
Calcium: 9.6 mg/dL (ref 8.4–10.5)
Chloride: 99 mEq/L (ref 96–112)
Creatinine, Ser: 1.27 mg/dL (ref 0.40–1.50)
GFR: 71.85 mL/min (ref >60.00)
Glucose, Bld: 94 mg/dL (ref 70–99)
Potassium: 4.4 mEq/L (ref 3.5–5.1)
Sodium: 136 mEq/L (ref 135–145)
Total Bilirubin: 1.2 mg/dL (ref 0.2–1.2)
Total Protein: 7.1 g/dL (ref 6.0–8.3)

## 2023-05-04 LAB — HEMOGLOBIN A1C: Hgb A1c MFr Bld: 5.7 % (ref 4.6–6.5)

## 2023-05-04 LAB — LIPID PANEL
Cholesterol: 209 mg/dL — ABNORMAL HIGH (ref 0–200)
HDL: 41.1 mg/dL (ref >39.00)
LDL Cholesterol: 137 mg/dL — ABNORMAL HIGH (ref 0–99)
NonHDL: 167.56
Total CHOL/HDL Ratio: 5
Triglycerides: 152 mg/dL — ABNORMAL HIGH (ref 0.0–149.0)
VLDL: 30.4 mg/dL (ref 0.0–40.0)

## 2024-01-11 ENCOUNTER — Telehealth: Payer: 59 | Admitting: Physician Assistant

## 2024-01-11 DIAGNOSIS — R058 Other specified cough: Secondary | ICD-10-CM | POA: Diagnosis not present

## 2024-01-11 DIAGNOSIS — K219 Gastro-esophageal reflux disease without esophagitis: Secondary | ICD-10-CM | POA: Diagnosis not present

## 2024-01-12 MED ORDER — BENZONATATE 100 MG PO CAPS
100.0000 mg | ORAL_CAPSULE | Freq: Three times a day (TID) | ORAL | 0 refills | Status: AC | PRN
Start: 2024-01-12 — End: ?

## 2024-01-12 MED ORDER — PANTOPRAZOLE SODIUM 40 MG PO TBEC
40.0000 mg | DELAYED_RELEASE_TABLET | Freq: Every day | ORAL | 0 refills | Status: AC
Start: 2024-01-12 — End: ?

## 2024-01-12 NOTE — Progress Notes (Signed)
E-Visit for Cough   We are sorry that you are not feeling well.  Here is how we plan to help!  Based on your presentation I believe you most likely have a cough that is a combination of residual inflammation after recent illness as well as exacerbated by acid reflux. I am guessing that while you notice heartburn at times, you are having silent reflux as well, which is contributing to the worsening of cough with eating and laying down. I have prescribed Pantoprazole to take once daily for 2 weeks. I have also sent in Tessalon to help with the symptom of the cough itself. I recommend and in person follow-up within 2 weeks with your PCP, especially if symptoms are not improving/resolving with the treatment given.    From your responses in the eVisit questionnaire you describe inflammation in the upper respiratory tract which is causing a significant cough.  This is commonly called Bronchitis and has four common causes:   Allergies Viral Infections Acid Reflux Bacterial Infection Allergies, viruses and acid reflux are treated by controlling symptoms or eliminating the cause. An example might be a cough caused by taking certain blood pressure medications. You stop the cough by changing the medication. Another example might be a cough caused by acid reflux. Controlling the reflux helps control the cough.  USE OF BRONCHODILATOR ("RESCUE") INHALERS: There is a risk from using your bronchodilator too frequently.  The risk is that over-reliance on a medication which only relaxes the muscles surrounding the breathing tubes can reduce the effectiveness of medications prescribed to reduce swelling and congestion of the tubes themselves.  Although you feel brief relief from the bronchodilator inhaler, your asthma may actually be worsening with the tubes becoming more swollen and filled with mucus.  This can delay other crucial treatments, such as oral steroid medications. If you need to use a bronchodilator  inhaler daily, several times per day, you should discuss this with your provider.  There are probably better treatments that could be used to keep your asthma under control.     HOME CARE Only take medications as instructed by your medical team. Complete the entire course of an antibiotic. Drink plenty of fluids and get plenty of rest. Avoid close contacts especially the very young and the elderly Cover your mouth if you cough or cough into your sleeve. Always remember to wash your hands A steam or ultrasonic humidifier can help congestion.   GET HELP RIGHT AWAY IF: You develop worsening fever. You become short of breath You cough up blood. Your symptoms persist after you have completed your treatment plan MAKE SURE YOU  Understand these instructions. Will watch your condition. Will get help right away if you are not doing well or get worse.    Thank you for choosing an e-visit.  Your e-visit answers were reviewed by a board certified advanced clinical practitioner to complete your personal care plan. Depending upon the condition, your plan could have included both over the counter or prescription medications.  Please review your pharmacy choice. Make sure the pharmacy is open so you can pick up prescription now. If there is a problem, you may contact your provider through [employer] and have the prescription routed to another pharmacy.  Your safety is important to [place]. If you have drug allergies check your prescription carefully.   For the next 24 hours you can use MyChart to ask questions about today's visit, request a non-urgent call back, or ask for a work or  school excuse. You will get an email in the next two days asking about your experience. I hope that your e-visit has been valuable and will speed your recovery.

## 2024-01-12 NOTE — Progress Notes (Signed)
I have spent 5 minutes in review of e-visit questionnaire, review and updating patient chart, medical decision making and response to patient.   Piedad Climes, PA-C
